# Patient Record
Sex: Male | Born: 1940 | Race: White | Hispanic: No | Marital: Married | Smoking: Former smoker
Health system: Southern US, Community
[De-identification: ages and names within clinical notes are randomized; demographics above are authoritative.]

## PROBLEM LIST (undated history)

## (undated) DIAGNOSIS — E119 Type 2 diabetes mellitus without complications: Secondary | ICD-10-CM

## (undated) DIAGNOSIS — I10 Essential (primary) hypertension: Secondary | ICD-10-CM

## (undated) DIAGNOSIS — F4310 Post-traumatic stress disorder, unspecified: Secondary | ICD-10-CM

## (undated) DIAGNOSIS — I2510 Atherosclerotic heart disease of native coronary artery without angina pectoris: Secondary | ICD-10-CM

## (undated) DIAGNOSIS — I6529 Occlusion and stenosis of unspecified carotid artery: Secondary | ICD-10-CM

## (undated) DIAGNOSIS — K219 Gastro-esophageal reflux disease without esophagitis: Secondary | ICD-10-CM

## (undated) DIAGNOSIS — G4730 Sleep apnea, unspecified: Secondary | ICD-10-CM

## (undated) DIAGNOSIS — IMO0001 Reserved for inherently not codable concepts without codable children: Secondary | ICD-10-CM

## (undated) DIAGNOSIS — E785 Hyperlipidemia, unspecified: Secondary | ICD-10-CM

## (undated) DIAGNOSIS — G2581 Restless legs syndrome: Secondary | ICD-10-CM

## (undated) DIAGNOSIS — M1990 Unspecified osteoarthritis, unspecified site: Secondary | ICD-10-CM

## (undated) DIAGNOSIS — Z860100 Personal history of colon polyps, unspecified: Secondary | ICD-10-CM

## (undated) DIAGNOSIS — K5730 Diverticulosis of large intestine without perforation or abscess without bleeding: Secondary | ICD-10-CM

## (undated) DIAGNOSIS — L719 Rosacea, unspecified: Secondary | ICD-10-CM

## (undated) HISTORY — DX: Occlusion and stenosis of unspecified carotid artery: I65.29

## (undated) HISTORY — DX: Diverticulosis of large intestine without perforation or abscess without bleeding: K57.30

## (undated) HISTORY — DX: Hyperlipidemia, unspecified: E78.5

## (undated) HISTORY — DX: Rosacea, unspecified: L71.9

## (undated) HISTORY — DX: Personal history of colon polyps, unspecified: Z86.0100

## (undated) HISTORY — DX: Sleep apnea, unspecified: G47.30

## (undated) HISTORY — DX: Reserved for inherently not codable concepts without codable children: IMO0001

## (undated) HISTORY — DX: Post-traumatic stress disorder, unspecified: F43.10

## (undated) HISTORY — DX: Gastro-esophageal reflux disease without esophagitis: K21.9

## (undated) HISTORY — DX: Personal history of colonic polyps: Z86.010

## (undated) HISTORY — DX: Type 2 diabetes mellitus without complications: E11.9

## (undated) HISTORY — DX: Unspecified osteoarthritis, unspecified site: M19.90

## (undated) HISTORY — DX: Atherosclerotic heart disease of native coronary artery without angina pectoris: I25.10

## (undated) HISTORY — DX: Essential (primary) hypertension: I10

## (undated) HISTORY — DX: Restless legs syndrome: G25.81

---

## 1979-07-24 HISTORY — PX: KNEE SURGERY: SHX244

## 2000-09-16 ENCOUNTER — Observation Stay (HOSPITAL_COMMUNITY): Admission: AD | Admit: 2000-09-16 | Discharge: 2000-09-18 | Payer: Self-pay | Admitting: Cardiology

## 2000-09-16 ENCOUNTER — Encounter: Payer: Self-pay | Admitting: Cardiology

## 2000-12-23 ENCOUNTER — Encounter: Payer: Self-pay | Admitting: Cardiology

## 2000-12-23 ENCOUNTER — Inpatient Hospital Stay (HOSPITAL_COMMUNITY): Admission: AD | Admit: 2000-12-23 | Discharge: 2000-12-25 | Payer: Self-pay | Admitting: Cardiology

## 2001-07-22 ENCOUNTER — Ambulatory Visit (HOSPITAL_COMMUNITY): Admission: RE | Admit: 2001-07-22 | Discharge: 2001-07-23 | Payer: Self-pay | Admitting: Cardiology

## 2003-05-07 ENCOUNTER — Encounter: Payer: Self-pay | Admitting: Gastroenterology

## 2003-10-19 ENCOUNTER — Encounter: Payer: Self-pay | Admitting: Internal Medicine

## 2004-10-03 ENCOUNTER — Ambulatory Visit: Payer: Self-pay | Admitting: Internal Medicine

## 2005-03-25 ENCOUNTER — Emergency Department (HOSPITAL_COMMUNITY): Admission: EM | Admit: 2005-03-25 | Discharge: 2005-03-25 | Payer: Self-pay | Admitting: Emergency Medicine

## 2005-03-30 ENCOUNTER — Ambulatory Visit: Payer: Self-pay | Admitting: Internal Medicine

## 2005-04-12 ENCOUNTER — Ambulatory Visit: Payer: Self-pay | Admitting: Cardiology

## 2005-04-19 ENCOUNTER — Ambulatory Visit: Payer: Self-pay

## 2005-05-03 ENCOUNTER — Ambulatory Visit: Payer: Self-pay | Admitting: Cardiology

## 2005-07-05 ENCOUNTER — Ambulatory Visit: Payer: Self-pay | Admitting: Cardiology

## 2005-08-02 ENCOUNTER — Ambulatory Visit: Payer: Self-pay | Admitting: Internal Medicine

## 2005-11-30 ENCOUNTER — Ambulatory Visit: Payer: Self-pay | Admitting: Internal Medicine

## 2005-12-19 ENCOUNTER — Ambulatory Visit: Payer: Self-pay | Admitting: Cardiovascular Disease

## 2005-12-19 ENCOUNTER — Ambulatory Visit (HOSPITAL_COMMUNITY): Admission: RE | Admit: 2005-12-19 | Discharge: 2005-12-19 | Payer: Self-pay | Admitting: Physician Assistant

## 2005-12-20 ENCOUNTER — Ambulatory Visit (HOSPITAL_COMMUNITY): Admission: RE | Admit: 2005-12-20 | Discharge: 2005-12-20 | Payer: Self-pay | Admitting: Cardiology

## 2005-12-20 ENCOUNTER — Ambulatory Visit: Payer: Self-pay | Admitting: Cardiology

## 2006-01-01 ENCOUNTER — Ambulatory Visit: Payer: Self-pay | Admitting: Cardiology

## 2006-01-04 ENCOUNTER — Ambulatory Visit: Payer: Self-pay

## 2006-06-26 ENCOUNTER — Ambulatory Visit: Payer: Self-pay | Admitting: Internal Medicine

## 2006-07-04 ENCOUNTER — Ambulatory Visit: Payer: Self-pay | Admitting: Cardiology

## 2006-07-26 ENCOUNTER — Ambulatory Visit: Payer: Self-pay

## 2006-08-08 ENCOUNTER — Ambulatory Visit: Payer: Self-pay | Admitting: Cardiology

## 2006-08-19 ENCOUNTER — Encounter: Payer: Self-pay | Admitting: Cardiology

## 2006-08-19 ENCOUNTER — Ambulatory Visit: Payer: Self-pay

## 2006-09-05 ENCOUNTER — Ambulatory Visit: Payer: Self-pay | Admitting: Cardiovascular Disease

## 2006-09-17 ENCOUNTER — Ambulatory Visit: Payer: Self-pay | Admitting: Cardiology

## 2006-09-25 ENCOUNTER — Ambulatory Visit: Payer: Self-pay | Admitting: Cardiovascular Disease

## 2006-12-24 DIAGNOSIS — K219 Gastro-esophageal reflux disease without esophagitis: Secondary | ICD-10-CM

## 2006-12-24 DIAGNOSIS — I10 Essential (primary) hypertension: Secondary | ICD-10-CM | POA: Insufficient documentation

## 2006-12-24 DIAGNOSIS — N400 Enlarged prostate without lower urinary tract symptoms: Secondary | ICD-10-CM

## 2006-12-24 DIAGNOSIS — K5730 Diverticulosis of large intestine without perforation or abscess without bleeding: Secondary | ICD-10-CM | POA: Insufficient documentation

## 2006-12-24 DIAGNOSIS — N529 Male erectile dysfunction, unspecified: Secondary | ICD-10-CM

## 2006-12-24 DIAGNOSIS — Z860100 Personal history of colon polyps, unspecified: Secondary | ICD-10-CM | POA: Insufficient documentation

## 2006-12-24 DIAGNOSIS — I2510 Atherosclerotic heart disease of native coronary artery without angina pectoris: Secondary | ICD-10-CM | POA: Insufficient documentation

## 2006-12-24 DIAGNOSIS — G2589 Other specified extrapyramidal and movement disorders: Secondary | ICD-10-CM | POA: Insufficient documentation

## 2006-12-24 DIAGNOSIS — M1990 Unspecified osteoarthritis, unspecified site: Secondary | ICD-10-CM | POA: Insufficient documentation

## 2006-12-24 DIAGNOSIS — E1149 Type 2 diabetes mellitus with other diabetic neurological complication: Secondary | ICD-10-CM

## 2006-12-24 DIAGNOSIS — L719 Rosacea, unspecified: Secondary | ICD-10-CM

## 2006-12-24 DIAGNOSIS — F4310 Post-traumatic stress disorder, unspecified: Secondary | ICD-10-CM

## 2006-12-24 DIAGNOSIS — G4730 Sleep apnea, unspecified: Secondary | ICD-10-CM | POA: Insufficient documentation

## 2006-12-24 DIAGNOSIS — E785 Hyperlipidemia, unspecified: Secondary | ICD-10-CM

## 2006-12-26 ENCOUNTER — Ambulatory Visit: Payer: Self-pay | Admitting: Internal Medicine

## 2006-12-26 DIAGNOSIS — D649 Anemia, unspecified: Secondary | ICD-10-CM | POA: Insufficient documentation

## 2006-12-27 LAB — CONVERTED CEMR LAB
Basophils Absolute: 0.1 10*3/uL (ref 0.0–0.1)
Eosinophils Absolute: 0.2 10*3/uL (ref 0.0–0.6)
Hgb A1c MFr Bld: 7.9 % — ABNORMAL HIGH (ref 4.6–6.0)
Lymphocytes Relative: 21 % (ref 12.0–46.0)
Monocytes Absolute: 0.8 10*3/uL — ABNORMAL HIGH (ref 0.2–0.7)
Neutro Abs: 5.5 10*3/uL (ref 1.4–7.7)
Neutrophils Relative %: 66.5 % (ref 43.0–77.0)
Platelets: 135 10*3/uL — ABNORMAL LOW (ref 150–400)
WBC: 8.3 10*3/uL (ref 4.5–10.5)

## 2007-01-14 ENCOUNTER — Ambulatory Visit: Payer: Self-pay

## 2007-01-27 ENCOUNTER — Encounter (INDEPENDENT_AMBULATORY_CARE_PROVIDER_SITE_OTHER): Payer: Self-pay | Admitting: *Deleted

## 2007-01-29 ENCOUNTER — Ambulatory Visit: Payer: Self-pay | Admitting: Cardiology

## 2007-02-05 ENCOUNTER — Ambulatory Visit: Payer: Self-pay | Admitting: Internal Medicine

## 2007-02-24 ENCOUNTER — Ambulatory Visit: Payer: Self-pay | Admitting: Family Medicine

## 2007-02-24 DIAGNOSIS — R609 Edema, unspecified: Secondary | ICD-10-CM | POA: Insufficient documentation

## 2007-02-25 LAB — CONVERTED CEMR LAB
ALT: 36 units/L (ref 0–53)
AST: 44 units/L — ABNORMAL HIGH (ref 0–37)
Alkaline Phosphatase: 55 units/L (ref 39–117)
Bilirubin, Direct: 0.1 mg/dL (ref 0.0–0.3)
CO2: 27 meq/L (ref 19–32)
Calcium: 9.1 mg/dL (ref 8.4–10.5)
Creatinine, Ser: 1.3 mg/dL (ref 0.4–1.5)
GFR calc Af Amer: 71 mL/min
GFR calc non Af Amer: 59 mL/min
Phosphorus: 3.2 mg/dL (ref 2.3–4.6)
Pro B Natriuretic peptide (BNP): 28 pg/mL (ref 0.0–100.0)
Sodium: 141 meq/L (ref 135–145)
Total Bilirubin: 0.7 mg/dL (ref 0.3–1.2)

## 2007-05-27 ENCOUNTER — Inpatient Hospital Stay (HOSPITAL_COMMUNITY): Admission: EM | Admit: 2007-05-27 | Discharge: 2007-06-02 | Payer: Self-pay | Admitting: Emergency Medicine

## 2007-05-27 ENCOUNTER — Encounter: Payer: Self-pay | Admitting: Internal Medicine

## 2007-05-27 ENCOUNTER — Ambulatory Visit: Payer: Self-pay | Admitting: Cardiovascular Disease

## 2007-06-09 ENCOUNTER — Ambulatory Visit: Payer: Self-pay | Admitting: Internal Medicine

## 2007-06-13 ENCOUNTER — Ambulatory Visit: Payer: Self-pay | Admitting: Gastroenterology

## 2007-06-16 ENCOUNTER — Ambulatory Visit: Payer: Self-pay | Admitting: Cardiology

## 2007-06-18 ENCOUNTER — Ambulatory Visit: Payer: Self-pay | Admitting: Gastroenterology

## 2007-06-18 DIAGNOSIS — K222 Esophageal obstruction: Secondary | ICD-10-CM

## 2007-06-25 ENCOUNTER — Ambulatory Visit: Payer: Self-pay | Admitting: Cardiology

## 2007-06-26 ENCOUNTER — Observation Stay (HOSPITAL_COMMUNITY): Admission: EM | Admit: 2007-06-26 | Discharge: 2007-06-28 | Payer: Self-pay | Admitting: Emergency Medicine

## 2007-06-26 ENCOUNTER — Ambulatory Visit: Payer: Self-pay | Admitting: Cardiology

## 2007-06-28 ENCOUNTER — Encounter: Payer: Self-pay | Admitting: Internal Medicine

## 2007-07-14 ENCOUNTER — Ambulatory Visit: Payer: Self-pay | Admitting: Cardiology

## 2007-07-14 ENCOUNTER — Encounter: Payer: Self-pay | Admitting: Internal Medicine

## 2007-07-14 LAB — CONVERTED CEMR LAB
Basophils Absolute: 0.1 10*3/uL (ref 0.0–0.1)
HCT: 32.7 % — ABNORMAL LOW (ref 39.0–52.0)
Lymphocytes Relative: 25.3 % (ref 12.0–46.0)
Monocytes Absolute: 0.7 10*3/uL (ref 0.2–0.7)
Neutro Abs: 4 10*3/uL (ref 1.4–7.7)
Platelets: 176 10*3/uL (ref 150–400)
RDW: 12.1 % (ref 11.5–14.6)

## 2007-07-15 IMAGING — CT CT ANGIO AOBIFEM WO/W CM
1 of 5 series · 13 of 46 positions shown, 18 images · IV contrast (omnipaque)
Comparison: none

CLINICAL DATA: Lower extremity claudication.  Cardiac stent.
 CT ANGIO AORTA AND BILATERAL FEMORAL RUNOFF  WITHOUT AND WITHOUT CONTRAST ? 09/17/06 AT 5888 HOURS:
TECHNIQUE: Precontrast images.  Postcontrast images reconstructed in multiplanar format.
 Contrast:  150 cc of Omnipaque 350.

[Series 2: runoff_w/o 3.0 b30f st · axial · 0.92mm/px · z∈[-1348,-88]mm · 13 of 582 slices shown, 18 images]
[im 39/582  soft-tissue]
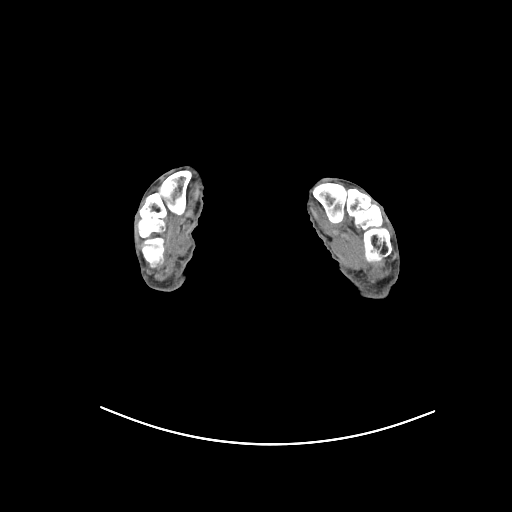
[im 39/582  bone]
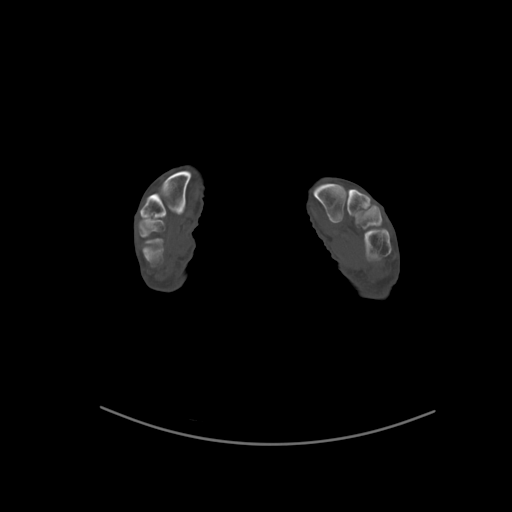
[im 78/582  soft-tissue]
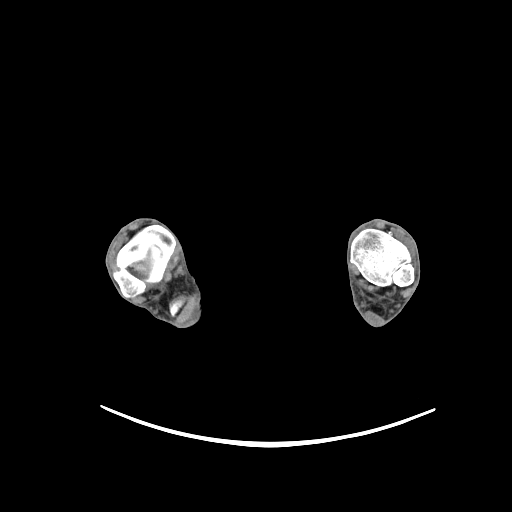
[im 117/582  soft-tissue]
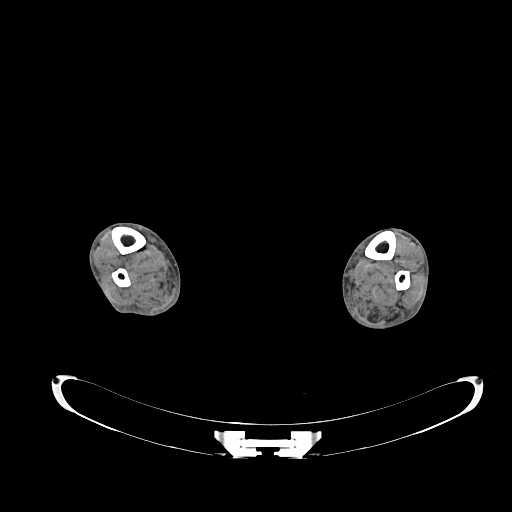
[im 194/582  soft-tissue]
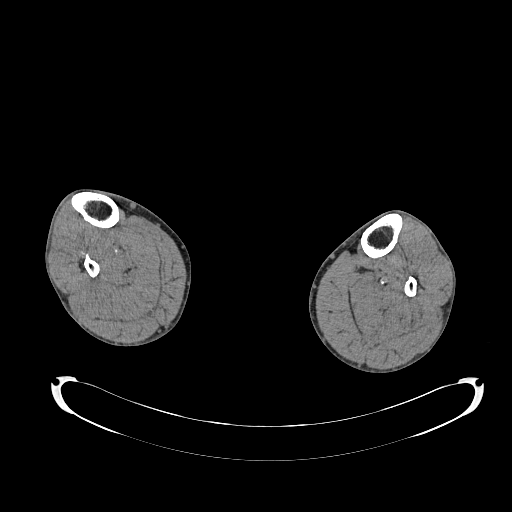
[im 233/582  soft-tissue]
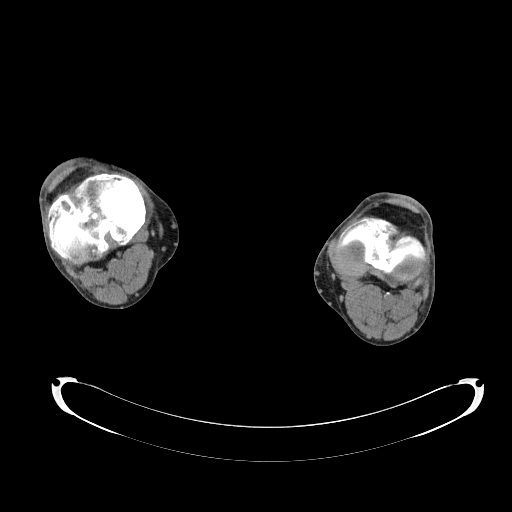
[im 272/582  soft-tissue]
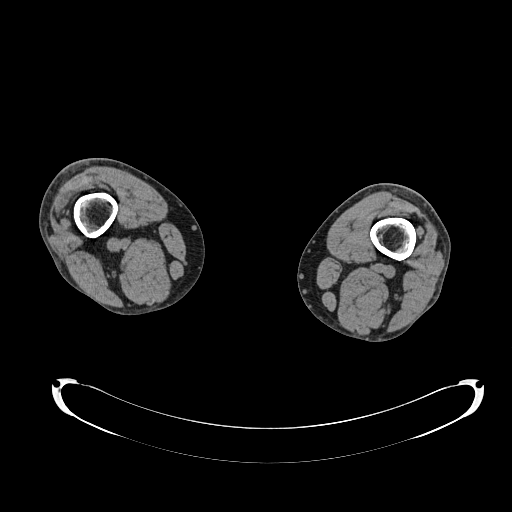
[im 310/582  soft-tissue]
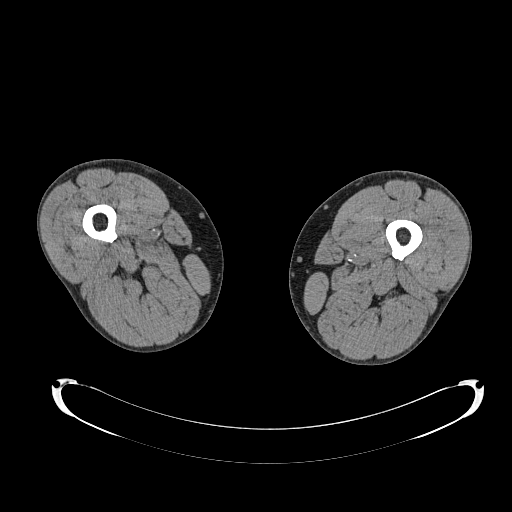
[im 349/582  soft-tissue]
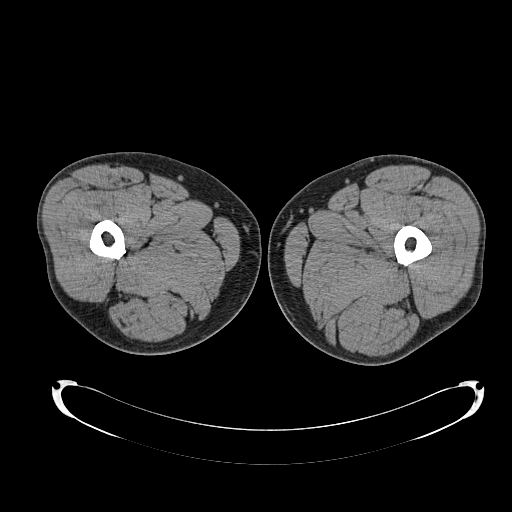
[im 388/582  soft-tissue]
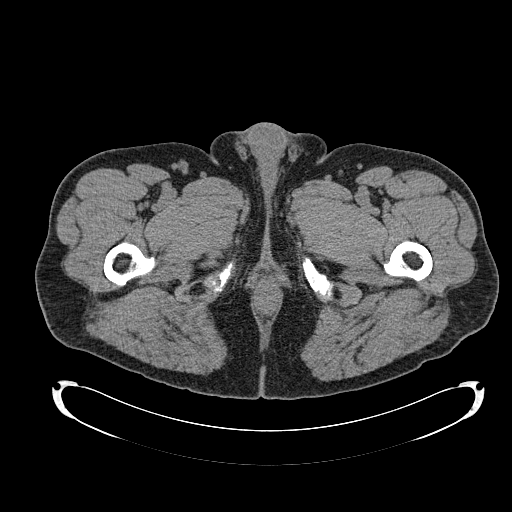
[im 388/582  bone]
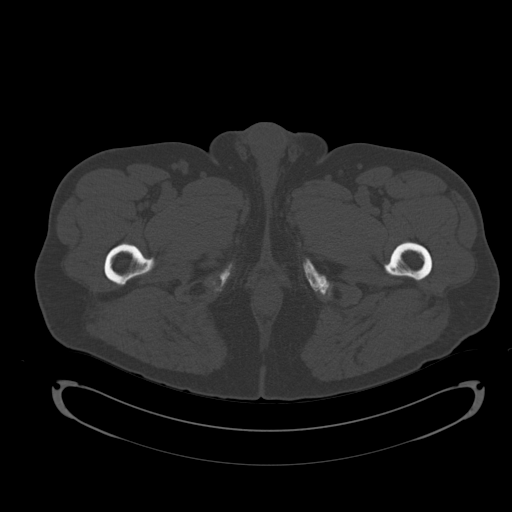
[im 427/582  lung]
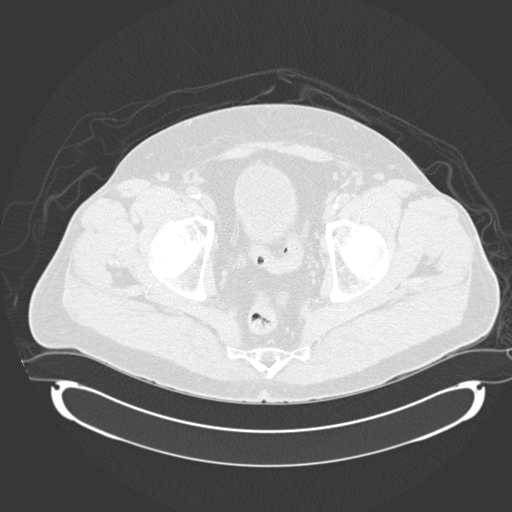
[im 465/582  soft-tissue]
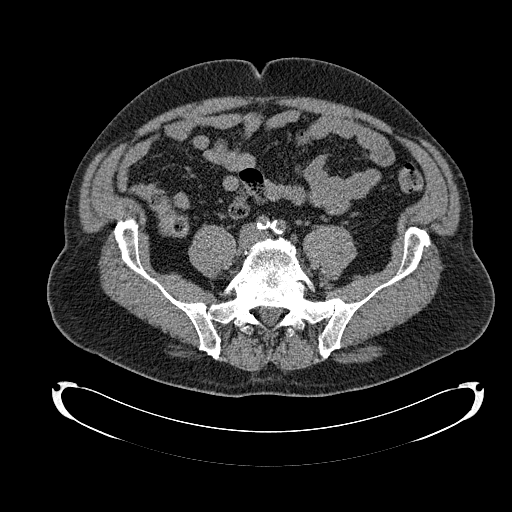
[im 465/582  lung]
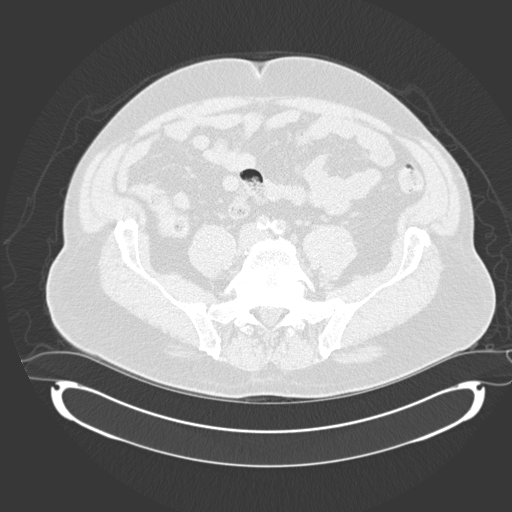
[im 504/582  soft-tissue]
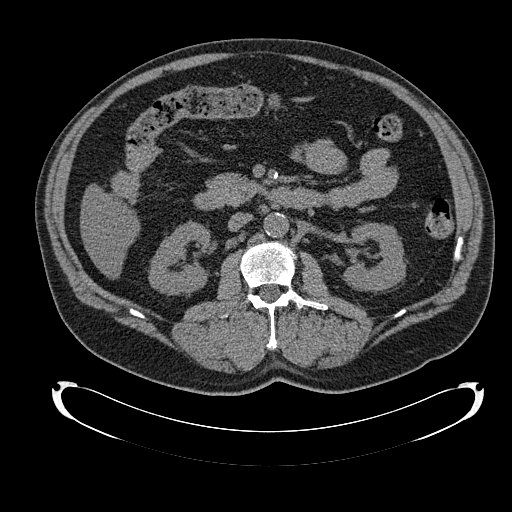
[im 504/582  lung]
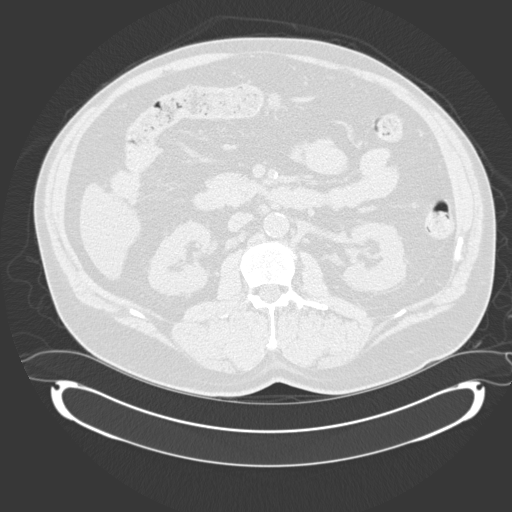
[im 543/582  soft-tissue]
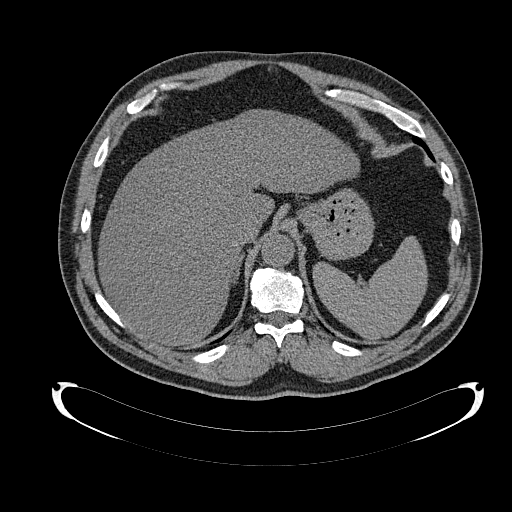
[im 543/582  lung]
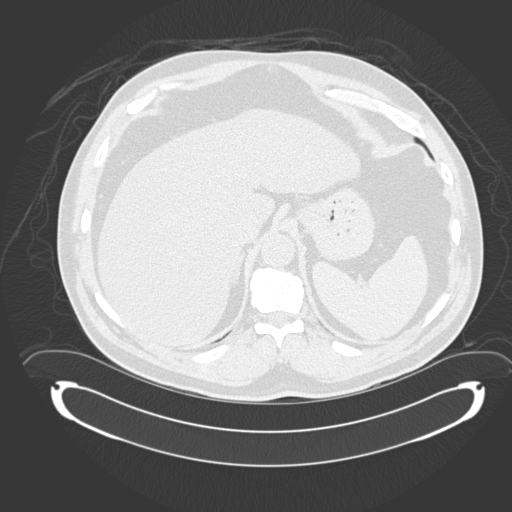

[13 of 46 positions shown; findings below may reference images not displayed]

FINDINGS: The aorta is nonaneurysmal and patent.  Single renal arteries are patent bilaterally.  There is marked narrowing at the origin of the celiac axis. The SMA is patent.  Branch vessels of the celiac are grossly patent.  The origin of the IMA is moderately diseased.  
 The right common iliac artery is patent.  The right external iliac artery is patent.  The right internal iliac artery is patent.  The left common iliac, external iliac, and internal iliac arteries are patent.
 Right lower extremity runoff demonstrates that the right common femoral and profunda femoral arteries are patent.  Mild diffuse atherosclerotic changes of the distal right superficial femoral artery are present without significant focal stenosis.  There is an approximately 40% stenosis of the right popliteal artery just below the knee joint. This is otherwise patent.  The right trifurcation vessels are patent to the ankle for three vessel runoff.  The left common femoral and profunda femoral arteries are patent.  Minimal atherosclerotic changes of the distal left superficial femoral artery are seen.  The left popliteal artery is patent. The left trifurcation vessels are patent to the left ankle for three vessel runoff on the left.  
 The liver, gallbladder, spleen, pancreas, adrenal glands, and kidneys are within normal limits.  There is no free fluid or abnormal adenopathy.  Degenerative changes are noted in the spine.  Diverticulosis of the sigmoid colon is present without diverticulitis.  L5 pars defects are noted with Grade I spondylolisthesis at L5-S1 noted.
IMPRESSION: 1.  No significant aortoiliac occlusive disease.
 2.  40% narrowing of the right popliteal artery, otherwise three vessel runoff on the right.
 3.  Three vessel runoff on the left with no significant arterial occlusive disease in the left lower extremity.
 4.  Grade I spondylolisthesis at L5-S1.

## 2007-08-01 ENCOUNTER — Ambulatory Visit: Payer: Self-pay | Admitting: Internal Medicine

## 2007-08-04 ENCOUNTER — Encounter (INDEPENDENT_AMBULATORY_CARE_PROVIDER_SITE_OTHER): Payer: Self-pay | Admitting: *Deleted

## 2007-08-04 ENCOUNTER — Encounter: Payer: Self-pay | Admitting: Internal Medicine

## 2007-08-04 LAB — CONVERTED CEMR LAB
ALT: 28 units/L (ref 0–53)
AST: 32 units/L (ref 0–37)
Albumin: 3.8 g/dL (ref 3.5–5.2)
Alkaline Phosphatase: 66 units/L (ref 39–117)
BUN: 25 mg/dL — ABNORMAL HIGH (ref 6–23)
Bilirubin, Direct: 0.1 mg/dL (ref 0.0–0.3)
Creatinine,U: 114.7 mg/dL
Eosinophils Absolute: 0.1 10*3/uL (ref 0.0–0.6)
GFR calc Af Amer: 77 mL/min
HDL: 34.4 mg/dL — ABNORMAL LOW (ref >39.0)
MCHC: 35.2 g/dL (ref 30.0–36.0)
Microalb, Ur: 1.3 mg/dL (ref 0.0–1.9)
Monocytes Absolute: 0.6 10*3/uL (ref 0.2–0.7)
Neutro Abs: 3.4 10*3/uL (ref 1.4–7.7)
Neutrophils Relative %: 62.7 % (ref 43.0–77.0)
Platelets: 168 10*3/uL (ref 150–400)
Potassium: 5.3 meq/L — ABNORMAL HIGH (ref 3.5–5.1)
RBC: 3.64 M/uL — ABNORMAL LOW (ref 4.22–5.81)
Sodium: 131 meq/L — ABNORMAL LOW (ref 135–145)
Total Bilirubin: 0.8 mg/dL (ref 0.3–1.2)
Total CHOL/HDL Ratio: 5.2
Total Protein: 6.6 g/dL (ref 6.0–8.3)
Triglycerides: 320 mg/dL (ref 0–149)

## 2007-08-06 ENCOUNTER — Ambulatory Visit: Payer: Self-pay | Admitting: Internal Medicine

## 2007-08-06 ENCOUNTER — Ambulatory Visit: Payer: Self-pay | Admitting: Cardiology

## 2007-08-06 LAB — CONVERTED CEMR LAB
OCCULT 1: NEGATIVE
OCCULT 2: NEGATIVE
OCCULT 3: NEGATIVE

## 2007-08-07 ENCOUNTER — Encounter (INDEPENDENT_AMBULATORY_CARE_PROVIDER_SITE_OTHER): Payer: Self-pay | Admitting: *Deleted

## 2007-08-07 LAB — CONVERTED CEMR LAB
Fecal Occult Blood: NEGATIVE
OCCULT 1: NEGATIVE
OCCULT 2: NEGATIVE
OCCULT 3: NEGATIVE
OCCULT 4: NEGATIVE
OCCULT 5: NEGATIVE

## 2007-08-15 ENCOUNTER — Ambulatory Visit: Payer: Self-pay | Admitting: Internal Medicine

## 2007-08-15 DIAGNOSIS — M19019 Primary osteoarthritis, unspecified shoulder: Secondary | ICD-10-CM | POA: Insufficient documentation

## 2007-08-16 DIAGNOSIS — K648 Other hemorrhoids: Secondary | ICD-10-CM | POA: Insufficient documentation

## 2007-08-16 DIAGNOSIS — N189 Chronic kidney disease, unspecified: Secondary | ICD-10-CM | POA: Insufficient documentation

## 2007-08-16 DIAGNOSIS — K2980 Duodenitis without bleeding: Secondary | ICD-10-CM | POA: Insufficient documentation

## 2007-08-22 ENCOUNTER — Encounter: Payer: Self-pay | Admitting: Internal Medicine

## 2007-08-28 ENCOUNTER — Ambulatory Visit: Payer: Self-pay | Admitting: Cardiology

## 2007-09-03 ENCOUNTER — Telehealth: Payer: Self-pay | Admitting: Internal Medicine

## 2007-09-04 ENCOUNTER — Ambulatory Visit: Payer: Self-pay | Admitting: Internal Medicine

## 2007-09-04 DIAGNOSIS — E1149 Type 2 diabetes mellitus with other diabetic neurological complication: Secondary | ICD-10-CM

## 2007-09-05 ENCOUNTER — Ambulatory Visit: Payer: Self-pay | Admitting: Internal Medicine

## 2007-09-08 ENCOUNTER — Encounter: Payer: Self-pay | Admitting: Internal Medicine

## 2007-09-11 ENCOUNTER — Ambulatory Visit: Payer: Self-pay | Admitting: Internal Medicine

## 2007-09-15 ENCOUNTER — Telehealth (INDEPENDENT_AMBULATORY_CARE_PROVIDER_SITE_OTHER): Payer: Self-pay | Admitting: *Deleted

## 2007-09-17 ENCOUNTER — Ambulatory Visit: Payer: Self-pay | Admitting: Cardiology

## 2007-09-17 ENCOUNTER — Telehealth (INDEPENDENT_AMBULATORY_CARE_PROVIDER_SITE_OTHER): Payer: Self-pay | Admitting: *Deleted

## 2007-10-09 ENCOUNTER — Ambulatory Visit: Payer: Self-pay | Admitting: Internal Medicine

## 2007-11-05 ENCOUNTER — Encounter: Payer: Self-pay | Admitting: Internal Medicine

## 2007-11-14 ENCOUNTER — Encounter: Payer: Self-pay | Admitting: Internal Medicine

## 2007-11-20 ENCOUNTER — Encounter: Payer: Self-pay | Admitting: Internal Medicine

## 2007-12-05 ENCOUNTER — Telehealth: Payer: Self-pay | Admitting: Internal Medicine

## 2007-12-18 ENCOUNTER — Ambulatory Visit: Payer: Self-pay | Admitting: Internal Medicine

## 2007-12-19 LAB — CONVERTED CEMR LAB
ALT: 29 units/L (ref 0–53)
AST: 33 units/L (ref 0–37)
Albumin: 3.8 g/dL (ref 3.5–5.2)
Alkaline Phosphatase: 50 units/L (ref 39–117)
BUN: 33 mg/dL — ABNORMAL HIGH (ref 6–23)
Basophils Absolute: 0 10*3/uL (ref 0.0–0.1)
Bilirubin, Direct: 0.1 mg/dL (ref 0.0–0.3)
Calcium: 9 mg/dL (ref 8.4–10.5)
Creatinine, Ser: 2.2 mg/dL — ABNORMAL HIGH (ref 0.4–1.5)
Eosinophils Absolute: 0.1 10*3/uL (ref 0.0–0.7)
GFR calc Af Amer: 38 mL/min
GFR calc non Af Amer: 32 mL/min
MCV: 93 fL (ref 78.0–100.0)
Monocytes Relative: 13.6 % — ABNORMAL HIGH (ref 3.0–12.0)
Neutrophils Relative %: 49.9 % (ref 43.0–77.0)
Phosphorus: 4.1 mg/dL (ref 2.3–4.6)
Sodium: 137 meq/L (ref 135–145)
Total Bilirubin: 0.7 mg/dL (ref 0.3–1.2)
Total Protein: 6.2 g/dL (ref 6.0–8.3)
WBC: 4.5 10*3/uL (ref 4.5–10.5)

## 2008-01-02 ENCOUNTER — Ambulatory Visit: Payer: Self-pay | Admitting: Internal Medicine

## 2008-01-05 LAB — CONVERTED CEMR LAB
BUN: 23 mg/dL (ref 6–23)
Calcium: 8.9 mg/dL (ref 8.4–10.5)
GFR calc non Af Amer: 58 mL/min
Potassium: 4.9 meq/L (ref 3.5–5.1)

## 2008-01-20 ENCOUNTER — Ambulatory Visit: Payer: Self-pay

## 2008-01-20 ENCOUNTER — Ambulatory Visit: Payer: Self-pay | Admitting: Cardiology

## 2008-01-30 ENCOUNTER — Ambulatory Visit: Payer: Self-pay | Admitting: Cardiology

## 2008-04-10 ENCOUNTER — Ambulatory Visit: Payer: Self-pay | Admitting: Cardiovascular Disease

## 2008-04-11 ENCOUNTER — Encounter: Payer: Self-pay | Admitting: Cardiology

## 2008-04-11 ENCOUNTER — Inpatient Hospital Stay (HOSPITAL_COMMUNITY): Admission: EM | Admit: 2008-04-11 | Discharge: 2008-04-13 | Payer: Self-pay | Admitting: Emergency Medicine

## 2008-04-21 ENCOUNTER — Inpatient Hospital Stay (HOSPITAL_COMMUNITY): Admission: EM | Admit: 2008-04-21 | Discharge: 2008-04-24 | Payer: Self-pay | Admitting: Emergency Medicine

## 2008-04-21 ENCOUNTER — Ambulatory Visit: Payer: Self-pay | Admitting: Cardiology

## 2008-04-27 ENCOUNTER — Ambulatory Visit: Payer: Self-pay | Admitting: Cardiology

## 2008-05-06 ENCOUNTER — Encounter: Payer: Self-pay | Admitting: Internal Medicine

## 2008-05-18 ENCOUNTER — Ambulatory Visit: Payer: Self-pay | Admitting: Cardiology

## 2008-06-25 ENCOUNTER — Ambulatory Visit: Payer: Self-pay | Admitting: Cardiology

## 2008-10-18 ENCOUNTER — Encounter: Payer: Self-pay | Admitting: Internal Medicine

## 2008-11-16 ENCOUNTER — Ambulatory Visit: Payer: Self-pay | Admitting: Cardiology

## 2008-11-16 ENCOUNTER — Encounter: Payer: Self-pay | Admitting: Internal Medicine

## 2008-12-13 ENCOUNTER — Ambulatory Visit: Payer: Self-pay | Admitting: Internal Medicine

## 2008-12-13 DIAGNOSIS — S40269A Insect bite (nonvenomous) of unspecified shoulder, initial encounter: Secondary | ICD-10-CM | POA: Insufficient documentation

## 2008-12-13 DIAGNOSIS — W57XXXA Bitten or stung by nonvenomous insect and other nonvenomous arthropods, initial encounter: Secondary | ICD-10-CM

## 2008-12-30 ENCOUNTER — Ambulatory Visit: Payer: Self-pay

## 2008-12-30 ENCOUNTER — Encounter: Payer: Self-pay | Admitting: Cardiology

## 2008-12-30 DIAGNOSIS — I6529 Occlusion and stenosis of unspecified carotid artery: Secondary | ICD-10-CM

## 2008-12-30 DIAGNOSIS — E669 Obesity, unspecified: Secondary | ICD-10-CM

## 2009-01-05 ENCOUNTER — Encounter: Payer: Self-pay | Admitting: Cardiology

## 2009-01-19 ENCOUNTER — Ambulatory Visit: Payer: Self-pay | Admitting: Cardiology

## 2009-01-19 ENCOUNTER — Ambulatory Visit: Payer: Self-pay

## 2009-01-19 DIAGNOSIS — I2510 Atherosclerotic heart disease of native coronary artery without angina pectoris: Secondary | ICD-10-CM

## 2009-01-26 ENCOUNTER — Telehealth: Payer: Self-pay | Admitting: Cardiology

## 2009-04-11 ENCOUNTER — Encounter (INDEPENDENT_AMBULATORY_CARE_PROVIDER_SITE_OTHER): Payer: Self-pay | Admitting: *Deleted

## 2009-05-19 ENCOUNTER — Encounter: Payer: Self-pay | Admitting: Cardiology

## 2009-05-19 ENCOUNTER — Encounter: Payer: Self-pay | Admitting: Internal Medicine

## 2009-05-19 ENCOUNTER — Ambulatory Visit: Payer: Self-pay | Admitting: Cardiology

## 2009-06-24 ENCOUNTER — Encounter (INDEPENDENT_AMBULATORY_CARE_PROVIDER_SITE_OTHER): Payer: Self-pay | Admitting: *Deleted

## 2009-06-27 ENCOUNTER — Encounter: Payer: Self-pay | Admitting: Cardiology

## 2009-06-28 ENCOUNTER — Ambulatory Visit: Payer: Self-pay | Admitting: Cardiology

## 2009-06-28 DIAGNOSIS — F17200 Nicotine dependence, unspecified, uncomplicated: Secondary | ICD-10-CM

## 2009-08-09 ENCOUNTER — Encounter: Payer: Self-pay | Admitting: Cardiology

## 2009-08-09 ENCOUNTER — Ambulatory Visit: Payer: Self-pay | Admitting: Cardiology

## 2009-10-18 ENCOUNTER — Encounter (INDEPENDENT_AMBULATORY_CARE_PROVIDER_SITE_OTHER): Payer: Self-pay | Admitting: *Deleted

## 2009-11-21 LAB — CONVERTED CEMR LAB: Hgb A1c MFr Bld: 8.1 %

## 2009-12-26 ENCOUNTER — Ambulatory Visit: Payer: Self-pay | Admitting: Internal Medicine

## 2009-12-26 DIAGNOSIS — R350 Frequency of micturition: Secondary | ICD-10-CM

## 2009-12-26 LAB — CONVERTED CEMR LAB
Bilirubin Urine: NEGATIVE
Glucose, Urine, Semiquant: >=1000
Protein, U semiquant: NEGATIVE
Urobilinogen, UA: 0.2

## 2010-01-02 ENCOUNTER — Ambulatory Visit: Payer: Self-pay | Admitting: Cardiology

## 2010-01-02 DIAGNOSIS — I359 Nonrheumatic aortic valve disorder, unspecified: Secondary | ICD-10-CM | POA: Insufficient documentation

## 2010-01-25 ENCOUNTER — Encounter: Payer: Self-pay | Admitting: Cardiology

## 2010-01-26 ENCOUNTER — Ambulatory Visit: Payer: Self-pay

## 2010-01-26 ENCOUNTER — Ambulatory Visit: Payer: Self-pay | Admitting: Cardiology

## 2010-01-26 ENCOUNTER — Encounter: Payer: Self-pay | Admitting: Cardiology

## 2010-01-26 ENCOUNTER — Encounter: Payer: Self-pay | Admitting: Cardiovascular Disease

## 2010-01-27 LAB — CONVERTED CEMR LAB
AST: 23 units/L (ref 0–37)
HDL: 29 mg/dL — ABNORMAL LOW (ref >39)
Total Bilirubin: 0.4 mg/dL (ref 0.3–1.2)
Total CHOL/HDL Ratio: 6.3
Total Protein: 6.3 g/dL (ref 6.0–8.3)

## 2010-02-03 ENCOUNTER — Telehealth (INDEPENDENT_AMBULATORY_CARE_PROVIDER_SITE_OTHER): Payer: Self-pay | Admitting: *Deleted

## 2010-03-24 ENCOUNTER — Encounter: Payer: Self-pay | Admitting: Internal Medicine

## 2010-03-24 ENCOUNTER — Ambulatory Visit: Payer: Self-pay | Admitting: Cardiology

## 2010-03-30 LAB — CONVERTED CEMR LAB
Total CHOL/HDL Ratio: 3.4
VLDL: 27 mg/dL (ref 0–40)

## 2010-06-20 ENCOUNTER — Encounter: Payer: Self-pay | Admitting: Cardiology

## 2010-06-22 ENCOUNTER — Ambulatory Visit: Payer: Self-pay | Admitting: Cardiology

## 2010-08-20 LAB — CONVERTED CEMR LAB
Albumin: 3.4 g/dL — ABNORMAL LOW (ref 3.5–5.2)
Bilirubin, Direct: 0.1 mg/dL (ref 0.0–0.3)
CO2: 27 meq/L (ref 19–32)
Chloride: 103 meq/L (ref 96–112)
Cholesterol: 118 mg/dL (ref 0–200)
Direct LDL: 49.1 mg/dL
Glucose, Bld: 84 mg/dL (ref 70–99)
HDL: 31.3 mg/dL — ABNORMAL LOW (ref >39.00)
Total Bilirubin: 0.9 mg/dL (ref 0.3–1.2)
Total CHOL/HDL Ratio: 4
Total Protein: 5.9 g/dL — ABNORMAL LOW (ref 6.0–8.3)
Triglycerides: 209 mg/dL — ABNORMAL HIGH (ref 0.0–149.0)

## 2010-08-24 NOTE — Assessment & Plan Note (Signed)
Summary: CHECK BS AFTER START INSULIN.   Vital Signs:  Patient Profile:   70 Years Old Male Weight:      247.25 pounds Temp:     96.7 degrees F oral Pulse rate:   72 / minute BP sitting:   106 / 50  (left arm) Cuff size:   large  Vitals Entered By: Wandra Mannan (September 11, 2007 8:44 AM)                 Chief Complaint:  check blood sugar after starting insulin.  History of Present Illness: No technical problems with lantus solostar Did meet with the diabetic counsellor briefly and will be going back Sugar this AM down to 123!!! this is the best it has been in a long time Generally had been over 200 regularly  Postprandial has been 168-266 in last few days No hypoglycemic spells--but did get a little dizzy after exercise yest. did discuss need to eat something before exercising when his sugar control is better  Really happy with the response in left shoulder to cortisone shot He would like to proceed with shot in right shoulder today    Current Allergies (reviewed today): * PLAVIX  Past Medical History:    Reviewed history from 09/04/2007 and no changes required:       Colonic polyps, hx of       Coronary artery disease---------Dr Hochrein       Diverticulosis, colon       GERD       Hyperlipidemia       Hypertension       Osteoarthritis--hands/knees       Benign prostatic hypertrophy       Rosacea       Restless legs syndrome       E.D.       Sleep apnea       PTSD       NIDDM              [facility]       Dr Lollie Marrow  [phone]         Past Surgical History:    Reviewed history from 08/01/2007 and no changes required:       1981  R knee surgery       2/02 Angioplasty ([facility])       1/03  CP--cath with stable CAD       5/04  Cardiolite--non-reversible defects  EF-61%       1/08  Adenosine myoview negative  EF-54%       2/08  CT angio negative for sig PVD       12/08  PCI/bare metal stent distal RCA   Social History:    Reviewed  history from 12/24/2006 and no changes required:       Retired--local truck driver       Married--2 sons       Former Smoker       Alcohol use-yes--occ beer    Review of Systems  The patient denies chest pain, syncope, and dyspnea on exhertion.         Has been able to exercise again with sugars down   Physical Exam  General:     alert and normal appearance.   Psych:     normally interactive, good eye contact, not anxious appearing, and not depressed appearing.      Impression & Recommendations:  Problem # 1:  DIABETES MELLITUS, TYPE II, UNCONTROLLED, W/NEUROLO COMPS (ICD-250.62) Assessment:  Improved markedly better now will [facility] with [facility] for approp counselling May need to be slow about increasing lantus in next week due to effects from cortisone shot keep April follow up  His updated medication list for this problem includes:    Lisinopril 40 Mg Tabs (Lisinopril) .Marland Kitchen... 1 daily    Glipizide 5 Mg Tabs (Glipizide) .Marland Kitchen... 2 two times a day    Metformin Hcl 1000 Mg Tabs (Metformin hcl) .Marland Kitchen... Take 1 tablet by mouth two times a day    Adult Aspirin Low Strength 81 Mg Tbdp (Aspirin) .Marland Kitchen... Take 1 tablet by mouth once a day    Lantus Solostar 100 Unit/ml Soln (Insulin glargine) .Marland Kitchen... 25 units daily   Problem # 2:  PRIMARY LOCALIZED OSTEOARTHROSIS SHOULDER REGION (ICD-715.11) Assessment: Comment Only PROCEDURE sterile prep posterior approach to right shoulder 2cc 2% lidocaine 40mg  kenalog, 9cc 2% lidocaine into joint  tolerated well  His updated medication list for this problem includes:    Acetaminophen 325 Mg Tabs (Acetaminophen) .Marland KitchenMarland KitchenMarland KitchenMarland Kitchen 3 every 6 hours as needed    Adult Aspirin Low Strength 81 Mg Tbdp (Aspirin) .Marland Kitchen... Take 1 tablet by mouth once a day  Orders: Joint Aspirate / Injection, Large (20610) Kenalog 10 mg inj (J3301)   Complete Medication List: 1)  Lisinopril 40 Mg Tabs (Lisinopril) .Marland Kitchen.. 1 daily 2)  Nortriptyline Hcl 25 Mg Caps  (Nortriptyline hcl) .... 2 daily 3)  Niaspan 1000 Mg Tbcr (Niacin (antihyperlipidemic)) .Marland Kitchen.. 1 daily 4)  Citalopram Hydrobromide 40 Mg Tabs (Citalopram hydrobromide) .Marland Kitchen.. 1& 1/2 daily 5)  Metoprolol Tartrate 50 Mg Tabs (Metoprolol tartrate) .Marland Kitchen.. 1 twice a day 6)  Simvastatin 40 Mg Tabs (Simvastatin) .... 1/2 daily 7)  Omeprazole 20 Mg Cpdr (Omeprazole) .... Take 1 tablet by mouth two times a day 8)  Hydrochlorothiazide 25 Mg Tabs (Hydrochlorothiazide) .... Take 1 tablet by mouth two times a day 9)  Acetaminophen 325 Mg Tabs (Acetaminophen) .... 3 every 6 hours as needed 10)  Clonazepam Odt 0.5 Mg Tbdp (Clonazepam) .... 2 hs 11)  Norvasc 10 Mg Tabs (Amlodipine besylate) .... 1/2 bid 12)  Glipizide 5 Mg Tabs (Glipizide) .... 2 two times a day 13)  Imdur 60 Mg Tb24 (Isosorbide mononitrate) .... Take 1 tablet by mouth once a day 14)  Clonidine Hcl 0.1 Mg Tabs (Clonidine hcl) .... Take 1 tablet by mouth two times a day 15)  Ticlopidine Hcl 250 Mg Tabs (Ticlopidine hcl) .... Take 1 tablet by mouth two times a day 16)  Tracer  17)  Metformin Hcl 1000 Mg Tabs (Metformin hcl) .... Take 1 tablet by mouth two times a day 18)  Adult Aspirin Low Strength 81 Mg Tbdp (Aspirin) .... Take 1 tablet by mouth once a day 19)  Lantus Solostar 100 Unit/ml Soln (Insulin glargine) .... 25 units daily   Patient Instructions: 1)  Keep April appointment    ] Current Allergies (reviewed today): * PLAVIX Current Medications (including changes made in today's visit):  LISINOPRIL 40 MG TABS (LISINOPRIL) 1 daily NORTRIPTYLINE HCL 25 MG  CAPS (NORTRIPTYLINE HCL) 2 daily NIASPAN 1000 MG TBCR (NIACIN (ANTIHYPERLIPIDEMIC)) 1 daily CITALOPRAM HYDROBROMIDE 40 MG TABS (CITALOPRAM HYDROBROMIDE) 1& 1/2 daily METOPROLOL TARTRATE 50 MG TABS (METOPROLOL TARTRATE) 1 twice a day SIMVASTATIN 40 MG TABS (SIMVASTATIN) 1/2 daily OMEPRAZOLE 20 MG CPDR (OMEPRAZOLE) Take 1 tablet by mouth two times a day HYDROCHLOROTHIAZIDE 25 MG  TABS (HYDROCHLOROTHIAZIDE) Take 1 tablet by mouth two times a day ACETAMINOPHEN 325 MG TABS (ACETAMINOPHEN) 3 every  6 hours as needed CLONAZEPAM ODT 0.5 MG  TBDP (CLONAZEPAM) 2 hs NORVASC 10 MG  TABS (AMLODIPINE BESYLATE) 1/2 bid GLIPIZIDE 5 MG  TABS (GLIPIZIDE) 2 two times a day IMDUR 60 MG  TB24 (ISOSORBIDE MONONITRATE) Take 1 tablet by mouth once a day CLONIDINE HCL 0.1 MG  TABS (CLONIDINE HCL) Take 1 tablet by mouth two times a day TICLOPIDINE HCL 250 MG  TABS (TICLOPIDINE HCL) Take 1 tablet by mouth two times a day * TRACER  METFORMIN HCL 1000 MG  TABS (METFORMIN HCL) Take 1 tablet by mouth two times a day ADULT ASPIRIN LOW STRENGTH 81 MG  TBDP (ASPIRIN) Take 1 tablet by mouth once a day LANTUS SOLOSTAR 100 UNIT/ML  SOLN (INSULIN GLARGINE) 25 units daily

## 2010-08-24 NOTE — Assessment & Plan Note (Signed)
Summary: 414.01 401.1  6 month   Visit Type:  Follow-up Primary Provider:  Cindee Salt MD  CC:  Coronary artery disease.  History of Present Illness: The patient presents for followup of the above. Since I last saw him he's had no new cardiovascular complaints. He was having chest discomfort 6 months ago but this seems to have improved. Currently he denies any new chest pressure, neck or arm discomfort. He is not having any new palpitations, presyncope or syncope. He is not describing any PND or orthopnea. He tries to do some walking for exercise. Continues to chew tobacco. He has had problems with frequent urination and is currently being treated for a possible urinary infection. He tries to watch his diet but his weight is up.  Current Medications (verified): 1)  Nortriptyline Hcl 25 Mg  Caps (Nortriptyline Hcl) .... 2 Daily 2)  Niaspan 1000 Mg Tbcr (Niacin (Antihyperlipidemic)) .Marland Kitchen.. 1 Daily 3)  Citalopram Hydrobromide 40 Mg Tabs (Citalopram Hydrobromide) .Marland Kitchen.. 1 Once Daily 4)  Metoprolol Tartrate 50 Mg Tabs (Metoprolol Tartrate) .... 3 Tabs Twice A Day 5)  Simvastatin 40 Mg Tabs (Simvastatin) .... 1/2 Daily 6)  Omeprazole 20 Mg Cpdr (Omeprazole) .... Take 1 Tablet By Mouth Two Times A Day 7)  Acetaminophen 325 Mg Tabs (Acetaminophen) .... 3 Every 6 Hours As Needed 8)  Clonazepam Odt 0.5 Mg  Tbdp (Clonazepam) .... Take 2 By Mouth At Bedtime 9)  Norvasc 10 Mg  Tabs (Amlodipine Besylate) .... Take 1/2 By Mouth Two Times A Day 10)  Isosorbide Dinitrate 20 Mg Tabs (Isosorbide Dinitrate) .... 2 By Mouth Three Times A Day 11)  Clonidine Hcl 0.1 Mg Tabs (Clonidine Hcl) .... 3 By Mouth Three Times A Day 12)  Lantus Solostar 100 Unit/ml  Soln (Insulin Glargine) .... Uad 13)  Lisinopril-Hydrochlorothiazide 20-12.5 Mg  Tabs (Lisinopril-Hydrochlorothiazide) .... Two Times A Day Tab 14)  Centrum Silver   Tabs (Multiple Vitamins-Minerals) .... Take 1 Tablet By Mouth Once A Day 15)  Prazosin Hcl 5  Mg Caps (Prazosin Hcl) .... Take One Tablet By Mouth Once Daily. 16)  Novolin R 100 Unit/ml Soln (Insulin Regular Human) .... Uad 17)  Pramipexole Dihydrochloride 0.125 Mg Tabs (Pramipexole Dihydrochloride) .... Take One Tablet By Mouth Once Daily. 18)  Aspirin Ec 325 Mg Tbec (Aspirin) .... Take One Tablet By Mouth Daily 19)  Tetracycline Hcl 250 Mg Caps (Tetracycline Hcl) .Marland Kitchen.. 1 By Mouth Q3-4 Days 20)  Precision Xtra Test Strips .... Tests Two Times A Day 21)  Ciprofloxacin Hcl 250 Mg Tabs (Ciprofloxacin Hcl) .Marland Kitchen.. 1 Tab By Mouth Two Times A Day For Prostate Infection  Allergies (verified): 1)  * Plavix  Past History:  Past Medical History: Colonic polyps, hx of Coronary artery disease (left main was normal, the LAD had a 50-60% stenosis in the mid vessel   and 60-70%                    stenosis in the distal vessel.  The first diagonal had an ostial 95% stenosis as it emerged from                            previous stented area.  The circumflex at 40% stenosis in the mid circumflex.  The right coronary                     artery had PDA with 90% stenosis, which has progressed  slightly from previous.)   Diverticulosis, colon GERD Hyperlipidemia Hypertension Osteoarthritis--hands/knees Benign prostatic hypertrophy Rosacea Restless legs syndrome E.D. Sleep apnea PTSD NIDDM Carotid artery stenosis   CON[facility]ULTANT[facility] Dr Lollie Marrow  [phone]  Review of Systems       As stated in the HPI and negative for all other systems.   Vital Signs:  Patient profile:   70 year old male Height:      72 inches Weight:      262 pounds BMI:     35.66 Pulse rate:   59 / minute Resp:     18 per minute BP sitting:   120 / 60  (right arm)  Vitals Entered By: Marrion Coy, CNA (January 02, 2010 11:20 AM)  Physical Exam  General:  Well developed, well nourished, in no acute distress. Head:  normocephalic and atraumatic Mouth:  Poor dentition  . Oral mucosa normal. Neck:  Neck supple, no  JVD. No masses, thyromegaly or abnormal cervical nodes. Chest Wall:  no deformities or breast masses noted Lungs:  Clear bilaterally to auscultation and percussion. Abdomen:  Bowel sounds positive; abdomen soft and non-tender without masses, organomegaly, or hernias noted. No hepatosplenomegaly, obese Msk:  Back normal, normal gait. Muscle strength and tone normal. Extremities:  Mbilateral lower extremity edema Neurologic:  Alert and oriented x 3. Skin:  Intact without lesions or rashes. Cervical Nodes:  no significant adenopathy Inguinal Nodes:  no significant adenopathy Psych:  Normal affect.   Detailed Cardiovascular Exam  Neck    Carotids: Carotids full and equal bilaterally, bilateral bruits left greater than right     Neck Veins: Normal, no JVD.    Heart    Inspection: no deformities or lifts noted.      Palpation: normal PMI with no thrills palpable.      Auscultation: S1 and S2 within normal limits, no S3, no S4, no clicks, no rubs, no murmurs.  Vascular    Abdominal Aorta: no palpable masses, pulsations, or audible bruits.      Femoral Pulses: normal femoral pulses bilaterally.      Pedal Pulses: normal pedal pulses bilaterally.      Radial Pulses: normal radial pulses bilaterally.      Peripheral Circulation: no clubbing, cyanosis   EKG  Procedure date:  01/02/2010  Findings:      Sinus rhythm, rate 59, axis within normal limits, intervals within normal limits, no acute ST-T wave changes.  Impression & Recommendations:  Problem # 1:  AORTIC VALVE DISORDERS (ICD-424.1) The patient has some mild aortic stenosis as well as septal hypertrophy. I will repeat an echocardiogram to evaluate this as the murmur is slightly louder. Orders: Echocardiogram (Echo)  Problem # 2:  TOBACCO USER (ICD-305.1) He understands the need to quit all tobacco products.  Problem # 3:  CAROTID STENOSIS (ICD-433.10) He is due to have carotid Dopplers this month and I will schedule  these.  Problem # 4:  CORONARY ATHEROSCLEROSIS NATIVE CORONARY ARTERY (ICD-414.01)  He is having no new symptoms. No further cardiovascular testing is suggested.he needs continued risk reduction.  Orders: EKG w/ Interpretation (93000)  Problem # 5:  HYPERTENSION (ICD-401.9) I reviewed a blood pressure diary. His readings areup and down but I think on average control. Rather than adding medications I would ask him to lose weight and suggested a weight watcher's approach.  Problem # 6:  HYPERLIPIDEMIA (ICD-272.4) I reviewed the last 2 lipid profiles. His LDLs have been extremely low. Given this and the new FDA  black box warning discussing drug interactions with amlodipine and Zocor I will reduce him to 10 mg of Zocor daily.  Patient Instructions: 1)  Your physician recommends that you schedule a follow-up appointment in: 6 months with Dr Antoine Poche 2)  Your physician recommends that you return for lab work on samde day as echo/carotid doppler.  Fasting  lipid and liver profile 272.0 v58.69 3)  Your physician has recommended you make the following change in your medication: Decrease Simvastatin to 10 mg every evening 4)  Your physician has requested that you have an echocardiogram.  Echocardiography is a painless test that uses sound waves to create images of your heart. It provides your doctor with information about the size and shape of your heart and how well your heart's chambers and valves are working.  This procedure takes approximately one hour. There are no restrictions for this procedure. 5)  You have been diagnosed with atrial flutter. Atrial flutter is a condition in which one of the upper chambers of the heart has extra electrical cells causing it to beat very fast. Please see the handout/brochure given to you today for further information. Prescriptions: SIMVASTATIN 10 MG TABS (SIMVASTATIN) ONE DAILY  #90 x 3   Entered by:   Charolotte Capuchin, RN   Authorized by:   [name],  MD, [facility]   Signed by:   Charolotte Capuchin, RN on 01/02/2010   Method used:   Print then Give to Patient   RxID:   [id number] SIMVASTATIN 10 MG TABS (SIMVASTATIN) ONE DAILY  #90 x 3   Entered by:   Charolotte Capuchin, RN   Authorized by:   [name], MD, [facility]   Signed by:   Charolotte Capuchin, RN on 01/02/2010   Method used:   Handwritten   RxID:   [id number] SIMVASTATIN 10 MG TABS (SIMVASTATIN) ONE DAILY  #90 x 3   Entered by:   Charolotte Capuchin, RN   Authorized by:   [name], MD, [facility]   Signed by:   Charolotte Capuchin, RN on 01/02/2010   Method used:   Handwritten   RxID:   [id number] SIMVASTATIN 10 MG TABS (SIMVASTATIN) ONE DAILY  #90 x 3   Entered by:   Charolotte Capuchin, RN   Authorized by:   [name], MD, [facility]   Signed by:   Charolotte Capuchin, RN on 01/02/2010   Method used:   Electronically to        [id number] (retail)       [address]       [place], [place]  [address]       Ph: [phone]       Fax: [phone]   RxID:   [id number]

## 2010-08-24 NOTE — Miscellaneous (Signed)
  Clinical Lists Changes  Observations: Added new observation of US CAROTID: Essentially stable, moderate carotid artery disease, bilaterally 40-59% bilateral ICA stenosis Too and fro flow is seen in the right vetebral artery  f/u 1 year [facility] (01/26/2010 10:36) Added new observation of ECHOINTERP:  - Left ventricle: The cavity size was normal. There was mild focal       basal hypertrophy of the septum. Systolic function was normal. The       estimated ejection fraction was in the range of 55% to 60%. Wall       motion was normal; there were no regional wall motion       abnormalities.     - Aortic valve: Trivial regurgitation. Valve area: 2.35cm 2(VTI).       Valve area: 2.71cm 2 (Vmax).     - Mitral valve: Calcified annulus. Trivial regurgitation.     - Left atrium: The atrium was mildly dilated.     - Right ventricle: Systolic function was normal.     - Tricuspid valve: Mild regurgitation.     - Pulmonary arteries: Systolic pressure was within the normal range.     - Inferior vena cava: The vessel was normal in size. (01/26/2010 10:31)      Echocardiogram  Procedure date:  01/26/2010  Findings:       - Left ventricle: The cavity size was normal. There was mild focal       basal hypertrophy of the septum. Systolic function was normal. The       estimated ejection fraction was in the range of 55% to 60%. Wall       motion was normal; there were no regional wall motion       abnormalities.     - Aortic valve: Trivial regurgitation. Valve area: 2.35cm 2(VTI).       Valve area: 2.71cm 2 (Vmax).     - Mitral valve: Calcified annulus. Trivial regurgitation.     - Left atrium: The atrium was mildly dilated.     - Right ventricle: Systolic function was normal.     - Tricuspid valve: Mild regurgitation.     - Pulmonary arteries: Systolic pressure was within the normal range.     - Inferior vena cava: The vessel was normal in size.  Carotid Doppler  Procedure date:   01/26/2010  Findings:      Essentially stable, moderate carotid artery disease, bilaterally 40-59% bilateral ICA stenosis Too and fro flow is seen in the right vetebral artery  f/u 1 year [employer]

## 2010-08-24 NOTE — Assessment & Plan Note (Signed)
Summary: per check out/sf   Visit Type:  Follow-up Primary Anthoney Sheppard:  Cindee Salt MD  CC:  CAD.  History of Present Illness: The patient presents for 6 followup. Since I last saw him he has had no new cardiovascular complaints. He does still get some chest discomfort but it is quite atypical. It lasted for 15-25 seconds at a time and is right upper chest. It is not like previous angina. He takes something for her indigestion is having this. He has been doing a little exercise for instance at the gym on a bicycle and this does not bring on the discomfort. He has not been short of breath and denies any PND or orthopnea. He does have bilateral joint pain that limits him somewhat. Unfortunately he has gained weight. He also had some lower extremity swelling 2 weeks ago started on Lasix. He brings a blood pressure diary today that demonstrates good blood pressure control in the morning but he is in the 170s 180 systolic in the evening and afternoon.   Current Medications (verified): 1)  Nortriptyline Hcl 25 Mg  Caps (Nortriptyline Hcl) .... 2 Daily 2)  Niaspan 1000 Mg Tbcr (Niacin (Antihyperlipidemic)) .Marland Kitchen.. 1 Daily 3)  Citalopram Hydrobromide 40 Mg Tabs (Citalopram Hydrobromide) .... 1/2 By Mouth Daily 4)  Metoprolol Tartrate 50 Mg Tabs (Metoprolol Tartrate) .... 3 Tabs Twice A Day 5)  Simvastatin 40 Mg Tabs (Simvastatin) .... 1/2 By Mouth Daily 6)  Omeprazole 20 Mg Cpdr (Omeprazole) .... Take 1 Tablet By Mouth Two Times A Day 7)  Acetaminophen 325 Mg Tabs (Acetaminophen) .... 3 Every 6 Hours As Needed 8)  Clonazepam Odt 0.5 Mg  Tbdp (Clonazepam) .... Take 2 By Mouth At Bedtime 9)  Norvasc 10 Mg  Tabs (Amlodipine Besylate) .... Take 1/2 By Mouth Two Times A Day 10)  Isosorbide Dinitrate 20 Mg Tabs (Isosorbide Dinitrate) .... 2 By Mouth Three Times A Day 11)  Clonidine Hcl 0.1 Mg Tabs (Clonidine Hcl) .... 3 By Mouth Two Times A Day 12)  Lantus Solostar 100 Unit/ml  Soln (Insulin Glargine)  .... Uad 13)  Lisinopril-Hydrochlorothiazide 20-12.5 Mg  Tabs (Lisinopril-Hydrochlorothiazide) .... Two Times A Day Tab 14)  Centrum Silver   Tabs (Multiple Vitamins-Minerals) .... Take 1 Tablet By Mouth Once A Day 15)  Prazosin Hcl 5 Mg Caps (Prazosin Hcl) .... Take One Tablet By Mouth Once Daily. 16)  Novolin R 100 Unit/ml Soln (Insulin Regular Human) .... Uad 17)  Pramipexole Dihydrochloride 0.125 Mg Tabs (Pramipexole Dihydrochloride) .... Take One Tablet By Mouth Once Daily. 18)  Aspirin Ec 325 Mg Tbec (Aspirin) .... Take One Tablet By Mouth Daily 19)  Tetracycline Hcl 250 Mg Caps (Tetracycline Hcl) .Marland Kitchen.. 1 By Mouth Q3-4 Days 20)  Precision Xtra Test Strips .... Tests Two Times A Day 21)  Furosemide 20 Mg Tabs (Furosemide) .Marland Kitchen.. 1 By Mouth Daily  Allergies (verified): 1)  * Plavix  Past History:  Past Medical History: Reviewed history from 01/02/2010 and no changes required. Colonic polyps, hx of Coronary artery disease (left main was normal, the LAD had a 50-60% stenosis in the mid vessel   and 60-70%                    stenosis in the distal vessel.  The first diagonal had an ostial 95% stenosis as it emerged from  previous stented area.  The circumflex at 40% stenosis in the mid circumflex.  The right coronary                     artery had PDA with 90% stenosis, which has progressed slightly from previous.)   Diverticulosis, colon GERD Hyperlipidemia Hypertension Osteoarthritis--hands/knees Benign prostatic hypertrophy Rosacea Restless legs syndrome E.D. Sleep apnea PTSD NIDDM Carotid artery stenosis   CONSULTANTS Dr Lollie Marrow  [phone]  Past Surgical History: Reviewed history from 12/29/2008 and no changes required. 1981  R knee surgery  Review of Systems       As stated in the HPI and negative for all other systems.   Vital Signs:  Patient profile:   70 year old male Height:      72 inches Weight:      267 pounds BMI:      36.34 Pulse rate:   60 / minute Resp:     18 per minute BP sitting:   128 / 64  (right arm)  Vitals Entered By: Marrion Coy, CNA (June 22, 2010 8:43 AM)  Physical Exam  General:  Well developed, well nourished, in no acute distress. Head:  normocephalic and atraumatic Eyes:  PERRLA/EOM intact; conjunctiva and lids normal. Mouth:  Poor dentition  . Oral mucosa normal. Neck:  Neck supple, no JVD. No masses, thyromegaly or abnormal cervical nodes. Chest Wall:  no deformities  Lungs:  Clear bilaterally to auscultation and percussion. Abdomen:  Bowel sounds positive; abdomen soft and non-tender without masses, organomegaly, or hernias noted. No hepatosplenomegaly, obese Msk:  Back normal, normal gait. Muscle strength and tone normal. Extremities:  Mbilateral lower extremity edema Neurologic:  Alert and oriented x 3. Skin:  Intact without lesions or rashes. Cervical Nodes:  no significant adenopathy Inguinal Nodes:  no significant adenopathy Psych:  Normal affect.   Detailed Cardiovascular Exam  Neck    Carotids: Carotids full and equal bilaterally, bilateral bruits left greater than right     Neck Veins: Normal, no JVD.    Heart    Inspection: no deformities or lifts noted.      Palpation: normal PMI with no thrills palpable.      Auscultation: S1 and S2 within normal limits, no S3, no S4, no clicks, no rubs, 306 apical systolic murmur radiating out aortic outflow tract  Vascular    Abdominal Aorta: no palpable masses, pulsations, or audible bruits.      Femoral Pulses: normal femoral pulses bilaterally.      Pedal Pulses: normal pedal pulses bilaterally.      Radial Pulses: normal radial pulses bilaterally.      Peripheral Circulation: no clubbing, cyanosis   EKG  Procedure date:  06/22/2010  Findings:      Sinus rhythm, rate 60, axis within normal limits, intervals within normal limits, no acute ST-T wave changes  Impression & Recommendations:  Problem # 1:   CORONARY ATHEROSCLEROSIS NATIVE CORONARY ARTERY (ICD-414.01) He is having no angina and will be with risk reduction Orders: EKG w/ Interpretation (93000)  Problem # 2:  AORTIC VALVE DISORDERS (ICD-424.1) His last echo was 2009. I do not suggest progression of his mild  Problem # 3:  OBESITY, UNSPECIFIED (ICD-278.00) I have prescribed the [facility] Diet  Problem # 4:  HYPERLIPIDEMIA (ICD-272.4) I will switch him to Lipitor per FDA recommendations  Problem # 5:  HYPERTENSION (ICD-401.9) I will be on hydralazine coverage as he is not at target with his daytime  blood pressures. Orders: TLB-BMP (Basic Metabolic Panel-BMET) (80048-METABOL)  Patient Instructions: 1)  Your physician recommends that you schedule a follow-up appointment in: 6 months with Dr Antoine Poche 2)  Your physician recommends that you have lab work today:  BMP 3)  Your physician has recommended you make the following change in your medication: Start Hydralazine 25 mg twice a day and Lipitor 20 mg once a day 4)  [place] Diet Prescriptions: LIPITOR 20 MG TABS (ATORVASTATIN CALCIUM) one daily  #30 x 6   Entered by:   Charolotte Capuchin, RN   Authorized by:   [name], MD, [facility]   Signed by:   Charolotte Capuchin, RN on 06/22/2010   Method used:   Electronically to        [id number] [address] (retail)       [address], [address]       [place], [place]  [address]       Ph: [phone]       Fax: [phone]   RxID:   [id number] HYDRALAZINE HCL 25 MG TABS (HYDRALAZINE HCL) twice a day  #60 x 11   Entered by:   Charolotte Capuchin, RN   Authorized by:   [name], MD, [facility]   Signed by:   Charolotte Capuchin, RN on 06/22/2010   Method used:   Electronically to        [facility] [address] (retail)       [address], [address]       [place], [place]  [address]       Ph: [phone]       Fax: [phone]   RxID:    [id number]  I have reviewed and approved all prescriptions at the time of this visit. [name], MD, [facility]  June 22, 2010 9:53 AM

## 2010-08-24 NOTE — Assessment & Plan Note (Signed)
Summary: using bathroom alot/alc   Vital Signs:  Patient profile:   70 year old male Weight:      258 pounds Temp:     98.3 degrees F oral BP sitting:   160 / 70  (left arm) Cuff size:   large  Vitals Entered By: Mervin Hack CMA Duncan Dull) (December 26, 2009 11:59 AM) CC: using bathroom alot   History of Present Illness: having trouble urinating Has to go frequently Repetitive nocturia--has actually had some leakage in bed Marked urgency and dribbling Has been on the prazosin  Does have some burning Looks like there "are streaks in it"  sugar control is about the same Last HgbA1c 8.1% in May had bladder scan done in [place] and had no retention  sugars have been fairly good generally  ~160 fasting if he is right with his eating  Allergies: 1)  * Plavix  Past History:  Past medical, surgical, family and social histories (including risk factors) reviewed for relevance to current acute and chronic problems.  Past Medical History: Colonic polyps, hx of Coronary artery disease (left main was normal, the LAD had a 50-60% stenosis in the mid vessel   and 60-70%                    stenosis in the distal vessel.  The first diagonal had an ostial 95% stenosis as it emerged from                            previous stented area.  The circumflex at 40% stenosis in the mid circumflex.  The right coronary                     artery had PDA with 90% stenosis, which has progressed slightly from previous.)   Diverticulosis, colon GERD Hyperlipidemia Hypertension Osteoarthritis--hands/knees Benign prostatic hypertrophy Rosacea Restless legs syndrome E.D. Sleep apnea PTSD NIDDM   CONSULTANTS Dr Lollie Marrow  [phone]  Past Surgical History: Reviewed history from 12/29/2008 and no changes required. 1981  R knee surgery  Family History: Reviewed history from 12/29/2008 and no changes required.  Mother died of MI at age 16.  Father died of MI at age   29.  He had a brother who  died of rheumatoid fever, and subsequently   diabetes and syncope.  He has another brother who has a history of CAD   and CABG.      Social History: Reviewed history from 12/29/2008 and no changes required. Lives in [place] with his wife, and he is a retired   [employer].  He has a 40-pack-year history of tobacco abuse, quitting   about 25 years ago.  Denies alcohol or drug use.  Does exercise daily in   the gym, using a stationary bike and light weights.      Review of Systems       ongoing knee pain--doesn't want to intervene no nausea or vomiting  Physical Exam  General:  alert and normal appearance.   Abdomen:  soft and non-tender.   No suprapubic dullness Rectal:  no hemorrhoids and no masses.   Prostate:  no gland enlargement, no nodules, and no asymmetry.  No sig tenderness   Impression & Recommendations:  Problem # 1:  FREQUENCY, URINARY (ICD-788.41) Assessment New  symptoms sound like worsening of BPH but relatively recent and had bladder scan 1 month ago that showed no retention could be  prostate infection but no tenderness doubt this is due to diabetes though he is spilling sugar urine  P: will try antibiotic for 3 weeks    consider adding finasteride if symptoms don't improve  His updated medication list for this problem includes:    Prazosin Hcl 5 Mg Caps (Prazosin hcl) .Marland Kitchen... Take one tablet by mouth once daily.  Orders: UA Dipstick w/o Micro (manual) ([address])  Complete Medication List: 1)  Nortriptyline Hcl 25 Mg Caps (Nortriptyline hcl) .... 2 daily 2)  Niaspan 1000 Mg Tbcr (Niacin (antihyperlipidemic)) .Marland Kitchen.. 1 daily 3)  Citalopram Hydrobromide 40 Mg Tabs (Citalopram hydrobromide) .Marland Kitchen.. 1 once daily 4)  Metoprolol Tartrate 50 Mg Tabs (Metoprolol tartrate) .... 3 tabs twice a day 5)  Simvastatin 40 Mg Tabs (Simvastatin) .... 1/2 daily 6)  Omeprazole 20 Mg Cpdr (Omeprazole) .... Take 1 tablet by mouth two times a day 7)  Acetaminophen 325 Mg Tabs  (Acetaminophen) .... 3 every 6 hours as needed 8)  Clonazepam Odt 0.5 Mg Tbdp (Clonazepam) .... Take 2 by mouth at bedtime 9)  Norvasc 10 Mg Tabs (Amlodipine besylate) .... Take 1/2 by mouth two times a day 10)  Isosorbide Dinitrate 20 Mg Tabs (Isosorbide dinitrate) .... 2 by mouth three times a day 11)  Clonidine Hcl 0.1 Mg Tabs (Clonidine hcl) .... 3 by mouth three times a day 12)  Lantus Solostar 100 Unit/ml Soln (Insulin glargine) .... Uad 13)  Lisinopril-hydrochlorothiazide 20-12.5 Mg Tabs (Lisinopril-hydrochlorothiazide) .... Two times a day tab 14)  Centrum Silver Tabs (Multiple vitamins-minerals) .... Take 1 tablet by mouth once a day 15)  Prazosin Hcl 5 Mg Caps (Prazosin hcl) .... Take one tablet by mouth once daily. 16)  Novolin R 100 Unit/ml Soln (Insulin regular human) .... Uad 17)  Pramipexole Dihydrochloride 0.125 Mg Tabs (Pramipexole dihydrochloride) .... Take one tablet by mouth once daily. 18)  Aspirin Ec 325 Mg Tbec (Aspirin) .... Take one tablet by mouth daily 19)  Tetracycline Hcl 250 Mg Caps (Tetracycline hcl) .Marland Kitchen.. 1 by mouth q3-4 days 20)  Precision Xtra Test Strips  .... Tests two times a day 21)  Ciprofloxacin Hcl 250 Mg Tabs (Ciprofloxacin hcl) .Marland Kitchen.. 1 tab by mouth two times a day for prostate infection  Patient Instructions: 1)  Please call if urinary symptoms have not improved after the antibiotic. We will need to add another medicine for your prostate Prescriptions: CIPROFLOXACIN HCL 250 MG TABS (CIPROFLOXACIN HCL) 1 tab by mouth two times a day for prostate infection  #42 x 0   Entered and Authorized by:   Cindee Salt MD   Signed by:   Cindee Salt MD on 12/26/2009   Method used:   Electronically to        [id number] (retail)       [address]       [place], [place]  [address]       Ph: [phone]       Fax: [phone]   RxID:   [id number]   Current Allergies (reviewed today): * PLAVIX  Laboratory Results   Urine  Tests  Date/Time Received: December 26, 2009 12:20 PM Date/Time Reported: December 26, 2009 12:20 PM  Routine Urinalysis   Color: yellow Appearance: Hazy Glucose: >=1000   (Normal Range: Negative) Bilirubin: negative   (Normal Range: Negative) Ketone: negative   (Normal Range: Negative) Spec. Gravity: 1.015   (Normal Range: 1.003-1.035) Blood: trace-lysed   (Normal Range: Negative) pH: 6.0   (  Normal Range: 5.0-8.0) Protein: negative   (Normal Range: Negative) Urobilinogen: 0.2   (Normal Range: 0-1) Nitrite: negative   (Normal Range: Negative) Leukocyte Esterace: negative   (Normal Range: Negative)     Blood Tests     HGBA1C: 8.1%   (Normal Range: Non-Diabetic - 3-6%   Control Diabetic - 6-8%)

## 2010-08-24 NOTE — Miscellaneous (Signed)
  Clinical Lists Changes  Observations: Added new observation of RS STUDY: TRACER - study completion 08/09/09 (10/18/2009 11:20)      Research Study Name: TRACER - study completion 08/09/09

## 2010-08-24 NOTE — Progress Notes (Signed)
Summary: Echo results  Phone Note Call from Patient Call back at Home Phone [phone]   Caller: Patient Summary of Call: echo results Initial call taken by: Judie Grieve,  February 03, 2010 10:58 AM  Follow-up for Phone Call        called with results Follow-up by: Charolotte Capuchin, RN,  February 03, 2010 11:18 AM

## 2010-08-24 NOTE — Miscellaneous (Signed)
Summary: Orders Update  Clinical Lists Changes  Problems: Added new problem of CAROTID ARTERY DISEASE (ICD-433.10) Orders: Added new Test order of Carotid Duplex (Carotid Duplex) - Signed 

## 2010-12-05 NOTE — Cardiovascular Report (Signed)
NAME:  [name] Ball, [name] Ball NO.:  [id number]   MEDICAL RECORD NO.:  [id number]          PATIENT TYPE:  INP   LOCATION:  2917                         FACILITY:  [facility]   PHYSICIAN:  Verne Carrow, MDDATE OF BIRTH:  1941/05/11   DATE OF PROCEDURE:  04/12/2008  DATE OF DISCHARGE:                            CARDIAC CATHETERIZATION   INDICATION:  Chest pain with exertion in a patient with known coronary  artery disease.   OPERATOR:  Verne Carrow, MD   PROCEDURES PERFORMED:  1. Left heart catheterization  2. Selective coronary angiography.  3. Left ventricular pressure measurement.   DETAILS OF PROCEDURE:  The patient was brought to the Heart  Catheterization Laboratory after signing informed consent.  The right  groin was prepped and draped in a sterile fashion.  A 5-French sheath  was inserted into the right femoral artery.  Standard diagnostic  catheters were used to perform selective coronary angiography.  A 5-  French pigtail catheter was used to cross the aortic valve into the left  ventricle.  No left ventricular angiogram was performed secondary to  elevated end-diastolic pressures.  The pigtail catheter was pulled back  across the aortic valve with no significant pressure gradient measured.  The patient tolerated the procedure well and was taken to the recovery  area where his sheath was removed.  Manual pressure was applied for  hemostasis.   ANGIOGRAPHIC FINDINGS:  1. The left main coronary artery was short and had no evidence of      disease.  This bifurcated into the left anterior descending and the      circumflex.  2. The left anterior descending had luminal irregularities in the      proximal portion.  The stented segment of the mid LAD showed no      significant restenosis.  There was a 50-60% stenosis in the mid LAD      as well as a 60-70% stenosis in the more distal portion of the LAD.      The first diagonal branch is a large vessel  that has a 95% ostial      stenosis that appears to have been created when the stent was      placed and jailed this vessel.  This is unchanged in appearance      from the last catheterization from December 2008.  3. The circumflex gives rise to a large obtuse marginal branch that      has no evidence of disease.  There is a 40% stenosis in the mid      circumflex just distal to the take off of the obtuse marginal      branch.  4. The right coronary artery has luminal irregularities in the      proximal portion.  The stented segment of the distal right coronary      artery has no restenosis noted.  There is a posterior descending      branch that has a 90% stenosis.  When comparing to the last      catheterization from December 2008, this has progressed from 80%  then to approximately 90% now.  5. No left ventricular angiogram was performed secondary to the      patient's elevated end-diastolic pressures.  6. Hemodynamic data, the LV pressure 178/24, end-diastolic pressure      41, central aortic pressure 188/87.   IMPRESSION:  1. Double-vessel coronary artery disease without significant      progression since last catheterization 9 months ago.  2. Elevated left ventricular filling pressures.   RECOMMENDATIONS:  The patient does have diffuse coronary artery disease  that has not changed significantly in the last 9 months since his last  heart catheterization.  He could certainly be having pain from the small  posterior descending branch or from the ostial stenosis of the diagonal  branch that was jailed during the stent placement in the mid LAD.  I  would recommend at this time getting a stress Myoview to define and  localize his areas of ischemia.  I think it would be reasonable if there  are areas of ischemia on the Myoview to proceed then with percutaneous  coronary intervention.  Of note, the patient is intolerant to PLAVIX,  but has tolerated Ticlid in the past.  He does  continue to have mild  anemia, but had negative GI workup a year ago.      Verne Carrow, MD  Electronically Signed     CM/MEDQ  D:  04/12/2008  T:  04/13/2008  Job:  [id number]   cc:   [name], MD, [facility]

## 2010-12-05 NOTE — H&P (Signed)
NAME:  [name] Ball, PETITE NO.:  [id number]   MEDICAL RECORD NO.:  [id number]          PATIENT TYPE:  EMS   LOCATION:  MAJO                         FACILITY:  [facility]   PHYSICIAN:  Christell Faith, MD   DATE OF BIRTH:  Jan 06, 1941   DATE OF ADMISSION:  05/26/2007  DATE OF DISCHARGE:                              HISTORY & PHYSICAL   CHIEF COMPLAINT:  Chest pain and high blood pressure.   HISTORY OF PRESENT ILLNESS:  This is a very pleasant 70 year old white  man who lives outside of [place].  He does have a history of  obstructive coronary artery disease and is status post percutaneous  intervention by Dr. Riley Kill in 2002 for progressive angina.  For the  past month or so, he has had angina progressive in both severity and  frequency.  Two weeks ago, his HCTZ and lisinopril were discontinued by  his [place] physician for concern of an elevated creatinine and since then,  he has noticed his blood pressure has been significantly higher.  He  checks his blood pressure several times throughout the day at home.  Not  surprisingly, his angina has intensified over this same time period.  His blood pressure is somewhat difficult to manage because he is  actually hypotensive and orthostatic in the morning.  He says he usually  arises, takes all of his medicines, and then drives to [place], and  by the time he gets there, he is very dizzy and very light-headed.  This  gradually improves throughout the morning and resolves by the afternoon.  He also goes to the gym every day and feels dizzy and light-headed after  working out.  He denies syncope.  He does get short of breath with his  angina.  The angina is classic, retrosternal pressure brought on with  exertion and relieved with nitroglycerin.  He has been taking  nitroglycerin more often.  He took two yesterday and one the day before.  Previously, he was using it about once a week.  He denies orthopnea or  PND.   PAST  MEDICAL HISTORY:  1. Progressive angina 2002 status post bare-metal stent to the LAD.      Residual stenosis of diagonal 60 to 70%.  Most recent      catheterization was May of 2007 after a Cardiolite scan suggested      possible apical ischemia.  This revealed 10% proximal LAD lesion,      70 to 80% first diagonal which was jailed by the LAD stent, 30%      proximal RCA lesion 60% in the mid PDA.  2. Last echo was January 2008.  It revealed an ejection fraction of 65      to 70%.  Mild aortic valve stenosis.  Mild aortic root dilatation.  3. Diabetes.  4. Apparently, he had an abnormal creatinine at the [place] two to three      weeks ago, but this is apparently resolved today.  5. Hypertension.  6. Lower-extremity edema, worse over the past two to three weeks.  7. Osteoarthritis.  8. History of  knee surgery.  9. BPH.  10.Obstructive sleep apnea.   SOCIAL HISTORY:  He lives outside of [place] with his wife.  He is a  retired Naval architect.  He is a Cytogeneticist and receives significant primary  care at the [place] in [place].  He quit smoking 25 years ago but previously  had a 40-pack-year history.  He use to drink two to three beers a day  but quit this three months ago.   FAMILY HISTORY:  Mother died in her 53s of an MI.  He has an uncle who  died of a stroke in his 18s.  Father was an alcoholic and died in his  29s.  He has two healthy children.   ALLERGIES:  PLAVIX APPARENTLY CAUSED A BACKACHE.  IT IS UNCLEAR IF THIS  REPRESENTS A TRUE ALLERGY.   MEDICATIONS:  1. Celexa 40 mg p.o. daily.  2. Klonopin p.r.n.  3. Norvasc 10 mg p.o. daily.  4. Glipizide 5 mg p.o. b.i.d.  5. Isosorbide dinitrate 10 mg p.o. t.i.d.  6. Lopressor 50 mg p.o. b.i.d.  7. Niaspan 1000 mg p.o. q.h.s.  8. Nortriptyline 50 mg p.o. q.h.s.  9. Omeprazole 20 mg p.o. b.i.d.  10.Prazosin 50 mg p.o. q.h.s.=.  11.Zocor 40 mg p.o. daily.  12.Aspirin 325 mg p.o. daily.   RECENTLY DISCONTINUED MEDICINES:  1. HCTZ 25 mg  p.o. daily.  2. Lisinopril 40 mg p.o. daily.  3. Metformin 850 mg p.o. t.i.d.  4. Ibuprofen.  5. Naltrexone.   REVIEW OF SYSTEMS:  EXTREMITIES:  Positive for lower-extremity edema.  CARDIAC:  Positive for fatigue, angina, exertional shortness of breath.  GI:  He had one episode of bright-red blood while constipated but no  further GI bleeding.  No stroke or TIA.  No palpitations.  No syncope  but positive for pre-syncope as described above.  Otherwise, the balance  of 12 systems is reviewed in detail and is negative except for what is  mentioned above.   PHYSICAL EXAMINATION:  VITAL SIGNS:  Temperature 98.6, initial blood  pressure 229/107, repeat 189/90, pulse 73, saturation 98% on room air,  respiratory rate 18.  GENERAL:  This is a pleasant, obese, white man in no distress.  HEENT:  Pupils are round and reactive.  Sclerae are clear.  Extraocular  movements are intact.  Mucous membranes are moist.  NECK:  Supple.  Neck veins are flat.  There are no true carotid bruits  but rather radiation of heart sounds.  LUNGS:  Clear to auscultation bilaterally without wheezing or rales.  CARDIAC EXAM:  Normal rate, regular rhythm.  There is a harsh late-  peaking systolic ejection murmur best heard at the base of the heart  consistent with aortic stenosis or sclerosis.  There is also a soft-  blowing murmur at the base.  ABDOMEN:  Obese, soft, nontender, nondistended.  EXTREMITIES:  Reveal 1+ bilateral pitting edema.  SKIN:  Complexion is reddy in the face with brawny skin changes of the  lower extremities.  Distal pulses are intact.  No clubbing or cyanosis.  NEUROLOGIC EXAM:  Grossly intact.  All four extremities have 5/5  strength.   DIAGNOSTIC TESTING:  Electrocardiogram reveals normal sinus rhythm with  a rate of 74.  No significant ST or T-wave changes and unchanged when  compared to prior.   LABORATORY DATA:  White blood cell 7.5, hemoglobin 10.3, platelets 145,  sodium 136,  potassium 3.7, BUN 22, creatinine 1.0, glucose 165.  CK MB  3.6,  troponin-I less than 0.05, INR 1.1.   IMPRESSION:  A 70 year old white man with a history of coronary disease,  now presenting with severe hypertension and progressive angina.   PLAN:  1. We will plan to admit the patient to step-down unit on nitro gtt.  2. Rule out myocardial infarction by cycling serial EKGs and cardiac      enzymes.  3. The patient's kidney function appears normal today.  It is noted      that he was taking a great deal of ibuprofen and that may have been      the etiology of his increased creatinine at the [facility]      two to three weeks ago.  In any regard, clearly an ACE inhibitor      would be beneficial in this hypertensive diabetic gentleman.  We      will restart his lisinopril and we will also restart his      hydrochlorothiazide as he is having considerable problems with      hypertension.  We will follow his electrolytes closely including      sodium, potassium and creatinine.  4. The chest pain is probably being driven primarily by his      hypertension.  He is known to have borderline obstructive disease      in particular in a diagonal artery.  I have not reviewed the films,      but this would probably not be a lesion that would be very amenable      to percutaneous intervention given that this vessel was jailed by      the LAD stent originally placed in 2002.  Therefore, good medical      management, including much better blood pressure control, is      probably the best therapy.  5. Likewise, he will need strict diabetes control, and again, given      that his kidney function is normal, we will plan to reinitiate      metformin upon discharge from the hospital.  6. Dr. Antoine Poche may wish to further evaluate the chest pain with a      stress test or a cardiac catheterization.  Therefore, we will make      the patient NPO after midnight tonight.  7. He has a heart murmur on  examination which correlates well with the      findings of his previous echo of aortic sclerosis.  8. Lower-extremity edema is probably related to dihydropyridine      calcium channel blocker as well as recent discontinuation of blood      pressure medicines.  His lungs are clear and there is no clinical      evidence of congestive heart failure.  9. Continue CPAP, especially given his difficult to treat hypertension      and now with lower-extremity edema.  He may have to have his      setting re-titrated at some point if there are continued signs of      pulmonary hypertension.  10.Obesity is a problem for this gentleman and weight loss would      probably allow for much better blood pressure and diabetes control.  11.His pre-syncope in the morning is probably related to taking all of      his blood pressure medicines at the same time in the morning.  What      we would like him to do is space his medicines out throughout the  day and we will work on this throughout his hospitalization and      upon discharge.  For now, we will formally check orthostatics in      the morning and make changes as needed.      Christell Faith, MD  Electronically Signed     NDL/MEDQ  D:  05/27/2007  T:  05/27/2007  Job:  [id number]   cc:   [name], MD, [facility]  [name], MD

## 2010-12-05 NOTE — H&P (Signed)
NAME:  [name] Ball, [name] NO.:  [id number]   MEDICAL RECORD NO.:  [id number]          PATIENT TYPE:  INP   LOCATION:  1825                         FACILITY:  [facility]   PHYSICIAN:  [name], MD, FACCDATE OF BIRTH:  January 07, 1941   DATE OF ADMISSION:  04/21/2008  DATE OF DISCHARGE:                              HISTORY & PHYSICAL   PRIMARY CARDIOLOGIST:  [name], MD, [facility]   PRIMARY CARE [name]:  [name] Schwalbe, MD   PATIENT PROFILE:  A 70 year old black Caucasian male with prior history  of CAD, status post recent catheterization, who presents with recurrent  chest pain.   1. Unstable angina/CAD.  1a.  On September 17, 2000, PCI and stenting of the LAD with placement of  a 3.5 x 20 mm radius bare-metal stent.  PTCA of the distal LAD.  1b.  On December 24, 2000, PCI of the LAD secondary to in-stent restenosis.  The diagonal was also balloon angioplastied.  1c.  On June 27, 2007, PCI of the distal RCA with placement of a 3.5  x 12 mm Liberte bare-mental status.  1d.  On April 12, 2008, cardiac catheterization left main normal.  LAD stent patent, 50-60% mid, 60-70% distal.  D1 95% ostial/jailed.  Left circumflex is 40% mid.  RCA stent is patent.  PDA 90%.  1. Anemia.  2. Hypertension.  3. Mild aortic stenosis.  4. Diabetes mellitus.  5. BPH.  6. Obstructive sleep apnea.  7. Remote tobacco abuse.  8. History of knee surgery.   HISTORY OF PRESENT ILLNESS:  A 70 year old married Caucasian male with  history of CAD, status post PCI of the LAD diagonal and distal RCA with  most recent catheterization on April 12, 2008, revealing patent LAD  and RCA stents with a jailed diagonal (95%) and progression of PDA  disease (90%).  It was felt medical therapy was appropriate, but that  the patient would benefit from Myoview to evaluate for focal ischemia  and determine if he would benefit from PCI either of the 2 sites.  He  was in his usual state of health  until today when at approximately 1:00  p.m., he was resting and developed 8/10 substernal chest heaviness  without associated symptoms.  He normally gets shortness of breath with  his chest pain.  He took a nitroglycerin without relief and then a  second nitroglycerin on the way to the ED.   At the time he arrived here, approximately 90 minutes after onset, chest  pain was 0/10.  He was markedly hypertensive upon arrival with blood  pressure of 193/79.  His pressure is now in the 170s.  He is currently  pain free.   ALLERGIES:  PLAVIX causes rash.   HOME MEDICATIONS:  1. Lopressor 100 mg b.i.d.  2. Tracer study drug daily.  3. Metformin 850 mg t.i.d.  4. Clonidine 0.2 mg b.i.d.  5. Imdur 120 mg daily.  6. Nitroglycerin 0.4 mg p.r.n.  7. Lantus 45 units nightly.  8. Clonazepam 0.5 mg nightly.  9. Nortriptyline unknown dose.  10.Mirapex 0.125 mg nightly.  11.Doxazosin 4 mg nightly.  12.Aspirin 325 mg daily.  13.Niaspan 1000 mg nightly.  14.Lisinopril and hydrochlorothiazide 20/12.5 mg daily.  15.Omeprazole 20 mg b.i.d.  16.Glipizide 10 mg b.i.d.  17.Zocor 20 mg nightly.  18.Norvasc 5 mg b.i.d.  19.Celexa 40 mg daily.  20.Multivitamin daily.   FAMILY HISTORY:  Mother died of MI at age 24.  Father died of MI at age  88.  He had a brother who died of rheumatoid fever, and subsequently  diabetes and syncope.  He has another brother who has a history of CAD  and CABG.   SOCIAL HISTORY:  Lives in [place] with his wife, and he is a retired  [employer]al architect.  He has a 40-pack-year history of tobacco abuse, quitting  about 25 years ago.  Denies alcohol or drug use.  Does exercise daily in  the gym, using a stationary bike and light weights.   REVIEW OF SYSTEMS:  Positive for chest pain and diabetes.  Otherwise,  all systems reviewed and negative.   PHYSICAL EXAMINATION:  VITAL SIGNS:  Temperature 97.9, heart rate 98,  respirations 21, blood pressure 193/79, and pulse ox 99% on  2 L.  GENERAL:  Pleasant white male, in no acute distress.  Awake, alert, and  oriented x3.  HEENT:  Normal.  NEUROLOGIC:  Grossly intact and nonfocal.  SKIN:  Warm and dry without lesions or masses.  NECK:  No bruits or JVD.  LUNGS:  Respirations are regular and unlabored.  Clear to auscultation.  CARDIAC:  Regular S1 and S2 with a 2/6 systolic ejection murmur at the  right upper sternal border heard throughout and also radiating to the  bilateral carotid.  ABDOMEN:  Round, soft, nontender, and nondistended.  Bowel sounds  present x4.  EXTREMITIES:  Warm, dry, and pink.  No clubbing or cyanosis with trace  bilateral lower extremity edema.  Dorsalis pedis posterior tibial pulses  are 2+ and equal bilaterally.  The right groin site, which was  previously used for cardiac catheterization is mildly ecchymotic,  however, there are no bruits, bleeding, or hematoma.   LABORATORY DATA:  Chest x-ray shows no active disease.  EKG shows sinus  rhythm at a rate of 94 with normal axis.  No acute ST-T changes.  Hemoglobin 10.9, hematocrit 32.3, WBC 8.1, and platelets 170.  Sodium  136, potassium 4.9, chloride 107, CO2 of 21, BUN 23, creatinine 1.2, and  glucose 268.  CK-MB 2.5 and troponin I less than 0.05.   ASSESSMENT AND PLAN:  1. Unstable angina/coronary artery disease.  The patient had angina at      rest today eventually relieved after a second nitroglycerin.  He      was hypertensive in the ED and his heart rates in the 90s.  He has      known small vessel disease involving the diagonal PDA.  We will      plan to admit and cycle cardiac markers.  Add heparin, IV nitro,      and continue his home medications with titration of the beta-      blocker.  Provided that cardiac markers are negative, we will plan      a Myoview in the a.m. to evaluate for ischemia and the diagonal      versus PDA territories with probable PCI to follow.  2. Hypertension.  This is poorly controlled.  The patient  states that      it typically runs in the 170s at home.  We will increase his      metoprolol to 150 b.i.d. as his heart rates are in the 90s here in      the ED.  We will also give him five IV Lopressor here.  We will      alter his Norvasc to 10 mg a day rather then 5 mg b.i.d.  We can      look to titrate his ACE inhibitor and diuretic.  I will rule out IV      nitroglycerin now.  3. Hyperlipidemia.  Continue statin therapy.  4. Anemia, stable.  Follow.  5. Diabetes mellitus.  Continue Lantus and glipizide.  We will hold      off on the metformin as he may be undergoing PCI this admission.      Add sliding scale insulin.      Nicolasa Ducking, ANP      Jesse Sans. Daleen Squibb, MD, [facility]  Electronically Signed    CB/MEDQ  D:  04/21/2008  T:  04/22/2008  Job:  [id number]

## 2010-12-05 NOTE — H&P (Signed)
NAME:  VERLON, PISCHKE NO.:  [id number]   MEDICAL RECORD NO.:  [id number]          PATIENT TYPE:  INP   LOCATION:  2030                         FACILITY:  [facility]   PHYSICIAN:  Vernice Jefferson, MD          DATE OF BIRTH:  December 27, 1940   DATE OF ADMISSION:  06/25/2007  DATE OF DISCHARGE:                              HISTORY & PHYSICAL   CARDIOLOGIST:  [name], MD, [facility]   CHIEF COMPLAINT:  Chest pain and hypertension.   HISTORY OF PRESENT ILLNESS:  The patient is a 70 year old white male  with a history of coronary artery disease who has been recently admitted  for an episode of hypertension with substernal chest pain.  He was  discharged after a left heart catheterization which did not demonstrate  any new obstructive coronary disease.  Did have a gel diagonal from a  prior mid-LAD stent.  Did not need any further interventions on that  hospitalization.  The patient has had his blood pressure medicines  titrated up by Dr. Antoine Poche as an outpatient.  The patient reports that  on his home blood pressure monitoring, he got readings of systolic's  greater than 220 and had some substernal chest pressure associated with  that.  It lasted for 1 minute and went away.  He had no associated  symptoms with it and it has not recurred since then.  The patient got  nervous about the chest pain and high blood pressure and came to the  emergency room.  Since he has been here in the ED, his blood pressures  have not been higher than 150 systolic and he has had no chest pain.   PAST MEDICAL HISTORY:  1. Coronary artery disease status post bare-metal stent to the LAD in      2002, residual ostial diag stenosis most recent heart      catheterization with no significant obstructive coronary disease.  2. Difficult to control hypertension with paroxysmal episodes of high      blood pressure.  3. Diabetes.  4. Chronic lower-extremity edema.  5. BPH.  6. Obstructive sleep apnea.   SOCIAL HISTORY:  He lives outside of [place] with his wife.  Retired  Naval architect.  Quit smoking 25 years ago.  Has a 40-pack-year history.  No current alcohol.  No drug abuse.   FAMILY HISTORY:  Noncontributory with the patient's current medical  condition.   ALLERGIES:  PLAVIX PER THE CHART WHICH CAUSED A BACKACHE.   MEDICATIONS:  Currently include:  1. Nortriptyline 50 mg once a day.  2. Glipizide 10 mg b.i.d.  3. Omeprazole 20 mg b.i.d.  4. Simvastatin 20 mg once a day.  5. Metoprolol was just increased to 100 b.i.d. by Dr. Antoine Poche.  6. Niacin 1 gram daily.  7. Aspirin 325 mg once a day.  8. Lisinopril 20 mg once a day.  9. Hydrochlorothiazide 12.5 mg once a day.  10.Imdur 60 mg once a day.  11.Amlodipine 10 mg daily.   REVIEW OF SYSTEMS:  Negative 11 point review of systems except for  otherwise  dictated in the above HPI.   PHYSICAL EXAMINATION:  VITAL SIGNS:  His blood pressure on my exam was  132/80, heart rate is 65.  He is afebrile breathing 12 times a minute  and 98% on room air.  GENERAL:  Well-developed, well-nourished, white male in no acute  distress.  HEENT:  Moist mucous membranes.  There is a scar noted to the temple  area.  NECK:  Supple.  Full range of motion.  No jugular venous distention.  CARDIOVASCULAR:  Regular rate and rhythm.  No murmurs, rubs or gallops.  CHEST:  Clear to auscultation bilaterally.  No wheezes, rales or  rhonchi.  ABDOMEN:  Soft, nondistended, nontender, normoactive bowel sounds.  EXTREMITIES:  Trace pretibial edema.   MEDICAL DECISION MAKING:  Chest x-ray not obtained here in the ED.   The EKG demonstrates normal sinus rhythm, normal axis and poor R-wave  progression on this EKG that I think is probably more likely due to lead  placement.  There are no acute ST-T changes.   LABORATORY DATA:  Hemoglobin 11.2, BUN and creatinine of 23 and 1.2,  cardiac biomarkers negative x1 set.   IMPRESSION:  1. Unstable angina with a  history of chronic stable angina.  2. Coronary artery disease status post percutaneous coronary      intervention in 2002 with no significant obstructive disease as of      his last catheterization 1 month ago.  3. Hypertension, uncontrolled and labile.  4. Diabetes.  5. Obstructive sleep apnea.   PLAN:  We will admit the patient to Dr. Jenene Slicker service and telemetry  monitoring to rule out for myocardial infarction by serial enzymes.  I  expect this is probably just his uncontrolled hypertension is probably  an etiology, not a true acute coronary syndrome.  We will hold heparin  right now given his biomarker negative.  We will continue on his home  medications.  He probably needs his blood pressure medicines further  titrated up for management of this chronic stable angina, additionally,  have not seen any serum metanephrine's that are ordered and we will do  so just to look for further paroxysmal etiologies of his hypertension.  Dr. Antoine Poche will see him in the morning.  We will keep him NPO for  right now if he chooses to do any further cardiovascular risk  stratification.      Vernice Jefferson, MD  Electronically Signed     JT/MEDQ  D:  06/26/2007  T:  06/26/2007  Job:  [id number]

## 2010-12-05 NOTE — Assessment & Plan Note (Signed)
[facility] OFFICE NOTE   [name] Ball, [name] Ball                      MRN:          [id number]  DATE:01/30/2008                            DOB:          1940/09/11    PRIMARY CARE PHYSICIAN:  [name], MD   REASON FOR PRESENTATION:  Evaluate the patient with coronary artery  disease and hypertension.   HISTORY OF PRESENT ILLNESS:  The patient is a pleasant 70 year old  gentleman with coronary artery disease as described below.  He returns  for followup.  He has done very well since I last saw him.  His blood  pressure has been a little labile as he keeps a blood pressure diary and  brought these results today.  He has had a little trouble with his blood  sugars as well and is having this managed by Dr. Alphonsus Sias and also his  physician at the [facility].  From a cardiovascular standpoint, he has had chest  discomfort.  This happens with exertion.  He will do things such as pull  the hose around his yard and might have to take a nitroglycerin.  He  does not have any resting symptoms.  He describes a substernal  discomfort that is his previous angina.  He thinks it is a stable  pattern, unchanged from the weeks and months before.  He denies any  nausea, vomiting, or diaphoresis.  He has not had any palpitation,  presyncope, or syncope.  He does have some decreased exercise tolerance.  This has been chronic as well.  He is unfortunately gaining weight.  He  seems to be confused about what to eat between his diabetes and his  heart disease.   PAST MEDICAL HISTORY:  Coronary artery disease (status post bare-metal  stent to the LAD in 2002, catheterization in December demonstrated a  left main fairly disease, the LAD stent was patent, there was mid and  distal vessel 70% stenosis, the diagonal came out of stent and had an  ostial 90% stenosis.  The right coronary artery is 70% stenosed.  There  appeared to be a plaque  rupture.  He had bare-metal stent placed to  this), difficulty controlling hypertension, diabetes, obesity, chronic  lower extremity edema, benign prostatic hypertrophy, sleep apnea, mild  renal insufficiency.   ALLERGIES:  PLAVIX caused backache and questionably a rash.   MEDICATIONS:  1. Doxazosin 4 mg at bedtime.  2. Omeprazole 20 mg b.i.d.  3. Metformin 850 mg t.i.d.  4. Citalopram 40 mg daily.  5. Pramipexole 0.125 mg at bedtime.  6. Amlodipine 5 mg b.i.d.  7. Niaspan 1000 mg at bedtime.  8. Simvastatin 40 mg daily.  9. Glipizide 10 mg b.i.d.  10.Lisinopril/HCTZ 20/12.5 b.i.d.  11.Metoprolol 75 mg b.i.d.  12.Centrum.  13.Aspirin 325 mg daily.  14.Tracer study drug.  15.Isosorbide mononitrate 60 mg daily.  16.Clonidine 0.1 mg b.i.d.   REVIEW OF SYSTEMS:  As stated in the HPI and otherwise negative for  other systems.   PHYSICAL EXAMINATION:  GENERAL:  The patient is in no distress.  VITAL SIGNS:  Blood pressure 131/61, heart rate 58 and regular, weight  258 pounds.  HEENT:  Eyelids unremarkable; pupils equal, round, and reactive to  light; fundi not visualized; oral mucosa unremarkable.  NECK:  No jugular venous distention 45 degrees; carotid upstroke brisk  and symmetrical; no bruits, no thyromegaly.  LYMPHATICS:  No cervical, no axillary, no inguinal adenopathy.  LUNGS:  Clear to auscultation bilaterally.  BACK:  No costovertebral angle tenderness.  CHEST:  Unremarkable.  HEART:  PMI not displaced or sustained, S1 and S2 within normal limits.  No S3, no S4, no clicks, no rubs, 2/6 apical systolic murmur radiating  slightly at the aortic outflow tract, no diastolic murmurs.  ABDOMEN:  Obese; positive bowel sounds, normal in frequency and pitch;  no bruits, no rebound,  no guarding, no midline pulsatile mass, no  hepatomegaly, no splenomegaly.  SKIN:  No rashes, no nausea.  EXTREMITIES:  2+ pulses throughout; trace bilateral lower extremity  edema; no cyanosis, no  clubbing.  NEURO:  Oriented to person, place, and time; cranial nerves II-XII are  grossly intact; motor grossly intact.   EKG, sinus rhythm, rate 58, axis within normal limits, intervals within  normal limits, and no acute ST wave changes.   ASSESSMENT AND PLAN:  1. Coronary artery disease.  The patient is having some angina.  He      has taken about 8-10 nitroglycerin in the past month, but says this      is a stable pattern and only happens with exertion.  At this point,      I do not think we need another noninvasive or invasive study.      Rather, I will increase his isosorbide mononitrate to 120 mg daily.      He will let me know if his pattern changes, at which point, I will      have a low threshold for another catheterization.  He will continue      with aggressive risk reduction.  2. Hypertension.  Blood pressure is somewhat labile.  He will need to      concentrate on weight loss.  I would not want to add another      medication at this point.  He is already on polypharmacy.  If he      can concentrate on weight loss and salt restriction, we may achieve      more stable and target blood pressures.  3. Obesity as above.  We discussed this at great length (greater than      half hour in this appointment).  Hopefully, he can comply with some      diet instructions and increase activity slowly to lose weight.  4. Dyslipidemia per Dr. Alphonsus Sias.  The goal should be an LDL less than      70 and HDL greater      than 40.  5. Followup.  We will see the patient back in 6 months or sooner if      needed.     [name], MD, [facility]  Electronically Signed    JH/MedQ  DD: 01/30/2008  DT: 01/31/2008  Job #: [id number]   cc:   [name], MD

## 2010-12-05 NOTE — Discharge Summary (Signed)
NAME:  [name] Ball, [name] Ball NO.:  [id number]   MEDICAL RECORD NO.:  [id number]          PATIENT TYPE:  INP   LOCATION:  2917                         FACILITY:  [facility]   PHYSICIAN:  [name], MD, FACCDATE OF BIRTH:  05-02-1941   DATE OF ADMISSION:  04/10/2008  DATE OF DISCHARGE:  04/13/2008                               DISCHARGE SUMMARY   DISCHARGING PHYSICIAN:  [name], MD, [facility]   PRIMARY CARDIOLOGIST:  [name], MD, [facility]   DISCHARGE DIAGNOSES:  1. Chest pain status post cardiac catheterization this admission.  The      patient with double-vessel coronary artery disease, elevated left      ventricular filling pressures.  No clear etiology of chest pain,      but the patient has multiple areas of stenosis with recommendations      for stress Myoview for further clarification.  2. Chronic anemia.  The patient to follow up outpatient with GI for      EGD.  3. Allergy to PLAVIX.   PAST MEDICAL HISTORY:  1. Labile hypertension.  2. Coronary artery disease status post bare metal stent to the LAD and      O2, bare metal stent to the RCA in 2008.  3. Mild aortic stenosis.  4. Diabetes.  5. BPH.  6. Obstructive sleep apnea.  7. History of knee surgery.  8. Remote history of tobacco use.  9. Premature CAD in maternal side of the family.  10.Participant in the tracer drug study.   PROCEDURES THIS ADMISSION:  Cardiac catheterization on April 12, 2008.   HOSPITAL COURSE:  [name] Ball is a 70 year old Caucasian gentleman with  known history of coronary artery disease ,who has chronic stable angina  x1 week prior to this admission.  However, on day of admission, he felt  that angina was worse than it had been.  He rated a seven over scale of  1-10 and it did not resolve after sublingual nitroglycerin.  The patient  presented to [facility] Emergency Room, was started on nitroglycerin  drip which relieved his pain.  The patient was admitted,  anticoagulated  with heparin.  Plavix intolerance noted.  It was felt the patient would  benefit from cardiac catheterization for further evaluation.  EKG showed  nonspecific inferior ST changes.  The patient was taken to the cath lab  on April 12, 2008, by Dr. Clifton James, results as stated above.  Dr.  Antoine Poche in to see the patient on day of discharge.  The patient's vital  signs stable.  Hemoglobin 10.6, potassium 4.2, creatinine 1.13.  The  patient being discharged home to follow up with Dr. Antoine Poche on April 27, 2008, at 2:45.  Dr. Antoine Poche will determine if the patient needs a  stress Myoview and in regards to the anemia, the patient to have an EGD  scheduled.  The patient is unsure at the present time, which  gastroenterologist he sees, but states it is in the [facility].  His wife will clarify this when she returns.   DISCHARGE MEDICATIONS:  The only change that  have been made is his  Lopressor, which has been increased to 100 mg b.i.d. and I have asked  the patient to hold the metformin until April 15, 2008.  He will  continue the tracer drug study.  There was some concern whether or not  his clonidine was 0.1 mg b.i.d. or 0.2 mg.  According to the patient, it  is 0.2 mg b.i.d.  We will continue this dose.  Isosorbide 120 mg daily,  nitroglycerin as needed p.r.n. sublingual, Lantus insulin as previously  prescribed at bedtime.  The patient takes clonazepam, nortriptyline,  Mirapex, and doxazosin.  He will continue these as previously  prescribed.  Aspirin 325 daily, Niaspan 1000 mg at bedtime,  lisinopril/hydrochlorothiazide 20/12.5 mg b.i.d., omeprazole 20 mg  b.i.d., glipizide 10 mg b.i.d., Zocor 20 mg nightly, amlodipine 5 mg  b.i.d., and Celexa 40 mg daily.   The patient is given the post cardiac catheterization discharge  instructions along with a prescription for his Lopressor for which the  dose is increased.   DURATION OF DISCHARGE:  Greater than 30  minutes.      Dorian Pod, ACNP      [name], MD, [facility]  Electronically Signed    MB/MEDQ  D:  04/13/2008  T:  04/14/2008  Job:  [id number]

## 2010-12-05 NOTE — Discharge Summary (Signed)
NAME:  [name] Ball, [name] Ball NO.:  [id number]   MEDICAL RECORD NO.:  [id number]          PATIENT TYPE:  INP   LOCATION:  6529                         FACILITY:  [facility]   PHYSICIAN:  Pricilla Riffle, MD, FACCDATE OF BIRTH:  05-28-1941   DATE OF ADMISSION:  06/25/2007  DATE OF DISCHARGE:  06/28/2007                               DISCHARGE SUMMARY   CARDIOLOGIST:  Dr. [name].   PRIMARY CARE Finian Helvey:  Dr. Benjamine Mola at the [facility].   DISCHARGE DIAGNOSIS:  Unstable angina.   SECONDARY DIAGNOSES:  1. Coronary artery disease status post bare metal stenting of the LAD      in 2002.  2. Poorly controlled hypertension.  3. Type 2 diabetes mellitus.  4. Chronic lower extremity edema.  5. Benign prostatic hypertrophy.  6. Obstructive sleep apnea.  7. Hyperlipidemia.  8. GERD.   ALLERGIES:  PLAVIX WHICH HAS CAUSED BACKACHE IN THE PAST.   PROCEDURES:  Left heart cardiac catheterization with successful PCI  stenting of the distal right coronary arteries with placement of a 3.5 x  12 mm Liberte bare metal stent.   HISTORY OF PRESENT ILLNESS:  A 70 year old male with prior history of  CAD status post bare metal stenting of the LAD in 2002.  He also has a  history of difficult to control hypertension and at times experience  substernal chest pressure associated with elevation of pressures into  the 200s last about a minute and resolving spontaneously.  Because of  recurrence of symptoms, the patient presented to the [facility] ED where  he was pain free with systolic pressures in the 150s.  He was admitted  for evaluation.   HOSPITAL COURSE:  The patient ruled out for MI and underwent left heart  cardiac catheterization on December 5.  Catheterization revealed a 70%  mid stenosis in the LAD with a 90% stenosis in the proximal diagonal off  of the stented area of the LAD.  There was also 70% stenosis in the  distal RCA and PDA.  The distal RCA was  successfully stented with a 3.5  x 12 mm Liberte bare metal stent.  Because the patient is allergic to  Plavix, he was initially on Ticlid therapy.  [name] Ball has not had a  recurrence in chest discomfort, and his blood pressures have been  elevated in the 150-179 systolic range.  As a result, his beta-blocker  was increased to 150 mg b.i.d.  He has been ambulating without  difficulty and will be discharged home today in satisfactory condition.  As he will be on Ticlid, he will require bimonthly CBC and platelet  checks which he will have performed at the [facility] and results faxed to our  office.  It was felt the patient has recurrent symptoms.  He should  undergo Myoview testing to determine the ischemic significance of the 7%  lesion in the mid-LAD.   DISCHARGE LABORATORIES:  Hemoglobin 11.2, hematocrit 32.6, WBC 6.8,  platelets 140, MCV 94.5.  Sodium 135, potassium 4.3, chloride 101, CO2  28, BUN 14, creatinine 1.22, glucose 100,  INR 1.0, total bilirubin 0.7,  alkaline phosphatase 69, AST 30, ALT 25, albumin 3.6, CK 136, MB to 2.2,  troponin I 0.02, calcium 9.6, magnesium 1.7, D-dimer was 0.57, plasma  metanephrines are pending.   DISPOSITION:  The patient is being discharged home today in good  condition.   FOLLOW-UP PLANS AND APPOINTMENTS:  We will arrange follow-up with Dr.  Antoine Poche in approximately 2 weeks.  We asked him to follow up with Dr.  Benjamine Mola in approximately one month for CBC and platelets and then  bimonthly from there forward.   DISCHARGE MEDICATIONS:  1. Aspirin 325 mg daily.  2. Ticlid 250 mg b.i.d.  3. Tracer study drug daily.  4. Lopressor 150 mg b.i.d.  5. Nortriptyline 50 mg q.h.s.  6. Glipizide 10 mg b.i.d.  7. Omeprazole 20 mg daily.  8. Simvastatin 20 mg q.h.s.  9. Niacin 1,000 mg q.h.s.  10.Lisinopril HCTZ 20/12.5 mg b.i.d.  11.Amlodipine 10 mg daily.  12.Imdur 60 mg daily.  13.Celexa 4 mg daily.  14.Clonazepam 1 mg q.h.s.  15.Nitroglycerin 0.4 mg  sublingual p.r.n. chest pain.   STUDIES:  None.   DURATION DISCHARGE ENCOUNTER:  Thirty eight minutes including physician  time.      Nicolasa Ducking, ANP      Pricilla Riffle, MD, [facility]  Electronically Signed    CB/MEDQ  D:  06/28/2007  T:  06/29/2007  Job:  [id number]   cc:   [facility] Dr. Benjamine Mola

## 2010-12-05 NOTE — Assessment & Plan Note (Signed)
[facility]                            CARDIOLOGY OFFICE NOTE   JAMARKIS, BRANAM                      MRN:          [id number]  DATE:06/16/2007                            DOB:          April 29, 1941    REASON FOR VISIT:  Evaluate patient with recent hospitalization for  shortness of breath and chest discomfort.   HISTORY OF PRESENT ILLNESS:  The patient was hospitalized on November 4  with chest discomfort. He ruled out for myocardial infarction. He  subsequently did have a cardiac catheterization by Dr. Riley Kill. This  demonstrated the left vein was free of critical disease, the LAD had 20%  stenosis. There is a previous stent that was widely patent. There was a  diagonal with 70-80% narrowing. This was unchanged from the previous.  The circumflex had 40-50% stenosis and a proximal marginal. The right  coronary artery demonstrated some nonobstructive mid plaque. There is a  PDA with an inferior branch 70% stenosis. The EF was normal. The patient  was managed medically. He was seen for his anemia. Dr. Arlyce Dice plans an  outpatient GI workup.   The patient has done well following his discharge. He has had some  elevated blood pressures with systolics off in the 170-180s. He denies  any chest discomfort, neck or arm discomfort. He has not had any  palpitations. He had light-headedness once recently after bending over.  When he stood up he was light headed and his blood pressure was low at  that point.   Of note during the patient's hospitalization, he did have problems with  his blood pressure falling. He had prazosin discontinued.   PAST MEDICAL HISTORY:  Coronary artery disease (stenting of the LAD in  2002, angioplasty in 2002 secondary to InStent restenosis in the LAD),  dyslipidemia with low HDL, right knee surgery, obesity, hypertension.   ALLERGIES:  PLAVIX caused a rash.   MEDICATIONS:  1. TRACER study drug.  2. Nortriptyline 50 mg  q.h.s.  3. Glipizide 10 mg b.i.d.  4. Tetracycline.  5. Omeprazole 20 mg b.i.d.  6. Simvastatin 20 mg daily.  7. Metoprolol 75 mg b.i.d.  8. Niacin 1000 mg daily.  9. Aspirin 325 mg daily.  10.Lisinopril HCT 20/12.5 b.i.d.  11.Amlodipine 10 mg daily.  12.Isosorbide mononitrate 60 mg daily.   REVIEW OF SYSTEMS:  As stated in the HPI and otherwise negative for  other systems.   PHYSICAL EXAMINATION:  GENERAL:  The patient is in no distress.  VITAL SIGNS:  Blood pressure 149/69, heart rate 61 and regular, weight  241 pounds, body mass index 33.  HEENT:  Eyelids unremarkable. Pupils equal round and reactive to light.  Fundi not visualized.  NECK:  No jugular venous distention at 45 degrees, carotid upstroke  brisk and symmetric, no bruits, no thyromegaly.  LYMPHATICS:  No cervical, axillary or inguinal adenopathy.  LUNGS:  Clear to auscultation bilaterally.  BACK:  No costovertebral angle tenderness.  CHEST:  Unremarkable.  HEART:  PMI not displaced or sustained, S1 and S2 within normal limits,  no S3, no S4, no clicks, no rubs,  2/6 apical systolic murmur radiating  at the aortic outflow tract, no diastolic murmurs.  ABDOMEN:  Obese, positive bowel sounds, normal in frequency and pitch,  no bruits, no rebound, no guarding, no midline pulsatile mass, no  hepatomegaly, no splenomegaly.  SKIN:  No rashes, no nodules.  EXTREMITIES:  2+ pulses throughout, no edema, no cyanosis, no clubbing.  NEUROLOGIC:  Oriented to person, place and time. Cranial nerves II-XII  grossly intact. Motor grossly intact.   ASSESSMENT/PLAN:  1. Coronary disease. The patient had no change in his coronary      disease. In particular, he had a patent left anterior descending      stent. At this point, he needs continued risk reduction but no      further cardiovascular testing. He is not bothered by the chest      discomfort.  2. Hypertension. This is elevated. He had problems with his blood      pressure  falling previously. He is no longer on the prazosin.      However, he needs better control of the high readings. I am going      to increase his Toprol to 100 mg b.i.d. and see how he does with      this.  3. Obesity. We discussed again the need to lose weight with diet and      exercise.  4. Systolic heart murmur. The patient did have a mild aortic valve      stenosis and will follow this clinically. No further imaging is      necessary.  5. Carotid stenosis. I think the patient can have this followed again      in the summer. It will be 2 years since the previous. He had 40-59%      bilateral stenosis in June 2007.  6. Followup. I will see him back again in about 6 weeks or sooner if      needed.     [name], MD, [facility]  Electronically Signed    JH/MedQ  DD: 06/16/2007  DT: 06/17/2007  Job #: [id number]   cc:   [name], MD

## 2010-12-05 NOTE — H&P (Signed)
NAME:  [name] Ball, [name] Ball NO.:  [id number]   MEDICAL RECORD NO.:  [id number]          PATIENT TYPE:  INP   LOCATION:  2909                         FACILITY:  [facility]   PHYSICIAN:  Christell Faith, MD   DATE OF BIRTH:  June 19, 1941   DATE OF ADMISSION:  04/10/2008  DATE OF DISCHARGE:                              HISTORY & PHYSICAL   PRIMARY CARDIOLOGIST:  [name], MD, [facility]   CHIEF COMPLAINT:  Chest pain.   HISTORY OF PRESENT ILLNESS:  This is a 70 year old white man with a  history of coronary artery disease who has chronic stable angina  approximately once a week.  His usual angina is brought on with exertion  and relieved with one nitroglycerin.  Today at about 3:00 p.m., he  developed angina while working in the yard.  It was more severe than  usual.  He rated at 7/10 and it did not resolve even after four  sublingual nitroglycerin.  Later at dinner, his wife noticed he was  taking more nitroglycerin for ongoing discomfort and insisted that he  come to the emergency department.  He is currently symptom free on a  nitroglycerin drip.  He is fairly limited in what he can do by dyspnea  and this is a chronic problem, but has gotten worse lately.   PAST MEDICAL HISTORY:  1. Hypertension, labile.  2. Coronary artery disease, status post bare metal stent to the LAD in      2002 and bare-metal stent to the RCA in December 2008.  He is also      known to have a 90% diagonal lesion and 70% mid LAD lesion.  3. Echocardiogram in January 2008 showed LV ejection fraction 65%-70%      with mild aortic stenosis.  4. Diabetes mellitus.  5. BPH.  6. Obstructive sleep apnea.  7. History of knee surgery.   SOCIAL HISTORY:  He lives in [place] with his wife.  He is a retired  [employer] architect, smoked 40 pack years, but quit 25 years ago.  Denies  drugs.   FAMILY HISTORY:  Mother died of an MI in her 74s and father died of an  MI in his 38s.   ALLERGIES:  PLAVIX causes  back pain and questionable rash and he does  list that as an allergy.   MEDICINES:  1. Doxazosin 4 mg p.o. nightly.  2. Omeprazole 20 mg p.o. b.i.d.  3. Metformin 850 mg p.o. t.i.d.  4. Celexa 40 mg p.o. daily.  5. Pramipexole 0.125 mg p.o. nightly.  6. Amlodipine 5 mg p.o. b.i.d.  7. Niaspan 1 g p.o. nightly.  8. Zocor 40 mg p.o. daily.  9. Glipizide 10 mg p.o. b.i.d.  10.Lisinopril/hydrochlorothiazide 20/12.5 mg p.o. b.i.d.  11.Lopressor 75 mg p.o. b.i.d.  12.Aspirin 325 mg p.o. daily.  13.Imdur 60 mg p.o. daily.  14.Clonidine 0.1 mg p.o. b.i.d.  15.Tracer study drug as prescribed.  16.Lantus 45 units subcu nightly.  17.Centrum vitamin.   REVIEW OF SYSTEMS:  Positive for chronic stable angina, dyspnea on  exertion, weakness, and back pain and musculoskeletal  pain.  Otherwise,  balance of 14 systems as reviewed and is negative.   PHYSICAL EXAMINATION:  VITAL SIGNS:  Temperature 97.90, pulse 81,  respiratory rate 16, blood pressure 185/75, and saturation 100% on room  air.  GENERAL:  This is a very pleasant white man in no acute distress.  Head  is normocephalic and atraumatic.  Pupils equal, round, and reactive to  light.  Extraocular movements are intact.  Mucous membranes are moist.  Dentition is good.  NECK:  Supple.  Neck veins are flat.  There is probably no carotid  bruits, although cardiac murmur is heard in the carotid listening area.  CARDIOVASCULAR:  Normal rate, regular rhythm.  Normal S1 and S2.  There  is a 3/6 systolic ejection murmur at the base of the heart and a 4/6  holosystolic murmur at the apex of the heart radiating to the axilla.  LUNGS:  Clear to auscultation bilaterally without wheezing or rales.  ABDOMEN:  Soft, nontender, nondistended with normal bowel sounds.  EXTREMITIES:  No edema.  2+ dorsalis pedis and radial pulses  bilaterally.  MUSCULOSKELETAL:  No acute joint effusions or deformities.  NEUROLOGIC:  Facial expressions are symmetric and  normal, 5/5 strength  in all four extremities.   DIAGNOSTIC TESTS:  Chest x-ray shows severe lordosis with no acute  process.  EKG shows sinus rhythm with a rate of 82 beats per minute and  nonspecific inferior ST changes.   LABS:  White blood cell 12.9, hemoglobin 10.5, platelets 177.  Sodium  137, potassium 4.3, BUN 32, creatinine 1.4, glucose 350, CK-MB 4.4,  troponin less than 0.05.   IMPRESSION:  This is a 70 year old white man with an extensive coronary  artery disease history, as well as diabetes, and significant  hypertension who presents with unstable angina and prominent cardiac  murmurs on examination.  Presents with unstable angina and on  examination prominent cardiac murmurs.   PLAN:  1. Admit to the step-down unit to Dr. Antoine Poche.  2. Rule out myocardial infarction by cycling serial EKGs and cardiac      enzymes.  3. Continue aspirin, initiate heparin drip, and continue nitroglycerin      drip to maintain pain-free status.  4. We will obtain an echocardiogram to reassess the patient's LV      ejection fraction and his cardiac murmurs.  5. We will check a BNP.  6. Continue his antihypertensive therapy and the nitroglycerin drip.      In addition, there is room to increase some of his blood pressure      medicines if indicated.  Consider evaluation for renal artery      stenosis if this has not been performed already.  7. Needs better blood sugar control.  Continue Lantus and cover with      oral medicines and sliding scale.  8. Given the significant angina at rest today, the patient probably      does warrant a repeat cardiac catheterization versus inpatient      stress test.  Further plan pending the results of his cardiac      enzymes and ongoing workup.      Christell Faith, MD  Electronically Signed     NDL/MEDQ  D:  04/11/2008  T:  04/11/2008  Job:  [id number]

## 2010-12-05 NOTE — Discharge Summary (Signed)
NAME:  TYRONNE, BLANN NO.:  [id number]   MEDICAL RECORD NO.:  [id number]          PATIENT TYPE:  INP   LOCATION:  2010                         FACILITY:  [facility]   PHYSICIAN:  Luis Abed, MD, FACCDATE OF BIRTH:  07/15/1941   DATE OF ADMISSION:  04/21/2008  DATE OF DISCHARGE:  04/24/2008                         DISCHARGE SUMMARY - REFERRING   ADMITTING PHYSICIAN:  Dr. Daleen Squibb.   DISCHARGING PHYSICIAN:  Dr. Myrtis Ser.   DISCHARGE DIAGNOSES:  1. Chest discomfort of uncertain etiology.  2. Negative adenosine Myoview associated with above hypertension,      hyperglycemia with a history of diabetes, known coronary artery      disease.  3. History as previously.   BRIEF HISTORY:  Mr. Miyoshi is a 70 year old African American male who  presents with reoccurring chest discomfort.  Most recent catheterization  in September 2009 recommended medical therapy and to do a stress test if  he had reoccurring symptoms given his anatomy.  He developed symptoms on  the day of admission thus he was admitted for further evaluation.   PAST MEDICAL HISTORY:  1. Anemia.  2. Hypertension.  3. Mild aortic stenosis.  4. Diabetes.  5. Benign prostatic hypertrophy.  6. Obstructive sleep apnea.  7. Remote tobacco use.  8. Knee surgery.  9. Known coronary artery disease with multiple interventions.   LABORATORY:  Weight on April 22, 2008 was a 115 kg.  On admission  hemoglobin was 10.9, hematocrit 32.3, normal indices, platelets 170,  WBCs 8.1.  At the time of discharge hemoglobin was 10.3 and hematocrit  30.9, normal indices, platelets 167, WBCs 7.5.  Admission PTT was 27, PT  13.6.  Sodium 138, potassium 4.6, BUN 16, creatinine 1.03, glucose 102.  Normal LFTs, alk phos was slightly low at 37.  CK-MBs, relative indexes  and troponins were within normal limits x4.  Chest x-ray on April 21, 2008 did not show any active disease.  His Myoview imaging on  April 22, 2008 showed no  findings for infarction or ischemia and an  ejection fraction of 65%.  EKG showed normal sinus rhythm, normal axis,  nonspecific ST-T wave changes.   HOSPITAL COURSE:  Mr. Klingler was admitted to [facility] by Christain Sacramento  nurse practitioner and Dr. Juanito Doom.  He was continued on his home  medications as well as IV heparin.  Overnight he did not have any  further chest discomfort.  He remained slightly hypertensive at 145/58.  He ruled out for myocardial infarction.  An adenosine Myoview was  performed on April 22, 2008 by Christain Sacramento nurse practitioner.  Imaging  is previously described.  Catheterization films were reviewed by Dr.  Riley Kill who again recommended continue medical treatment at this time.  He noted that the patient is not on Plavix given a history of rash.  By  April 24, 2008 he was ambulating without difficulty.  Dr. Myrtis Ser felt  that the patient could be discharged home to keep his appointment with  Dr. Antoine Poche this coming Tuesday.  Dr. Myrtis Ser also recommended checking a  blood sugar in the office  on Tuesday and to keep his appointment with  the [facility] for possible endoscopy next week.   DISPOSITION:  The patient is discharged home on April 24, 2008 and  asked to maintain a low salt, fat, cholesterol ADA diet.  Activity is  not restricted.  Wound care not applicable.  He was asked to continue  his home medications which include:  1. Lantus 45 mg at bedtime.  2. Clonazepam 0.5 at bedtime.  3. Nortriptyline at bedtime.  4. Mirapex 0.125 at bedtime.  5. Doxazosin 4 mg at bedtime.  6. Metformin 850 mg b.i.d.  7. Tracer study drug daily.  8. Celexa 40 mg daily.  9. Amlodipine 5 mg b.i.d.  10.Norvasc 20 mg at bedtime.  11.Glipizide 10 mg b.i.d.  12.Lopressor was increased to 150 mg b.i.d.  13.Omeprazole 20 mg b.i.d.  14.Lisinopril/hydrochlorothiazide 20/12.5 mg b.i.d.  15.Niaspan 1000 mg at bedtime.  16.Aspirin 325 mg daily.  17.Clonidine 0.2 mg b.i.d.  18.Imdur 120 mg  daily.  19.Nitroglycerin as needed.   FOLLOW UP:  He was asked to keep his follow-up appointment with Dr.  Antoine Poche on April 27, 2008.  [facility]  When he sees  [place] in the office he was asked that he have his blood sugar checked.  He  was asked to bring all medications to all appointments.   Discharge time:  35 minutes.      Joellyn Rued, PA-C      Luis Abed, MD, [facility]  Electronically Signed    EW/MEDQ  D:  04/24/2008  T:  04/25/2008  Job:  [id number]   cc:   [name], MD, [facility]  [name], MD

## 2010-12-05 NOTE — Assessment & Plan Note (Signed)
[facility]                         GASTROENTEROLOGY OFFICE NOTE   [name] Ball, [name] Ball                      MRN:          [id number]  DATE:06/13/2007                            DOB:          Jun 08, 1941    REASON FOR CONSULTATION:  1. Dysphagia.  2. Anemia.   [name] Ball is a pleasant 70 year old white male referred through the  courtesy of Dr. Alphonsus Sias for evaluation.  He was recently hospitalized for  chest pain.  A normochromic normocytic anemia was noted.  Hemoglobin was  as low as 9.4.  It was over 12 in June 2008.  [name] Ball had been taking  ibuprofen 800 mg 3 times a day chronically for back pain.  There is no  history of melena or hematochezia.  He claims that his stools did not  show evidence for blood.  Iron studies were not diagnostic for an iron  deficiency anemia.  An acute myocardial infarction was ruled out.  Since  discharge, he has had no further episodes of chest pain.  He does  complain of dysphagia to solids.  Occasionally he may experience  dysphagia with liquids as well.  He denies pyrosis.  He takes Prilosec  daily.  He was last colonoscoped in 2004 where a non-adenomatous polyp  was removed.   PAST MEDICAL HISTORY:  Pertinent for coronary artery disease.  He is  status post bare-metal stent placement.  He has hypertension, diabetes,  arthritis and anxiety and depression and sleep apnea.   FAMILY HISTORY:  Pertinent for heart disease in his mother.   MEDICATIONS:  1. Amlodipine.  2. Lisinopril/hydrochlorothiazide.  3. Aspirin.  4. Niacin.  5. Metoprolol.  6. Simvastatin.  7. Omeprazole.  8. Glipizide.  9. Tetracycline.  10.Citalopram.  11.Hydrochlorothiazide.  12.Clonazepam.   He is allergic to PLAVIX.   He neither smokes nor drinks.  He is married and retired.   REVIEW OF SYSTEMS:  Positive for joint pains, back pain, and some loss  of hearing.   PHYSICAL EXAMINATION:  Pulse 56, blood pressure 106/68, weight  240.  HEENT: EOMI.  PERRLA.  Sclerae are anicteric.  Conjunctivae are pink.  NECK:  Supple without thyromegaly, adenopathy or carotid bruits.  CHEST:  Clear to auscultation and percussion without adventitious  sounds.  CARDIAC:  Regular rhythm; normal S1 S2.  There are no murmurs, gallops  or rubs.  ABDOMEN:  Bowel sounds are normoactive.  Abdomen is soft, nontender and  nondistended.  There are no abdominal masses, tenderness, splenic  enlargement or hepatomegaly.  EXTREMITIES:  Full range of motion.  No cyanosis, clubbing or edema.  RECTAL:  Deferred.   IMPRESSION:  1. Dysphagia:  Most likely secondary to peptic esophageal stricture.  2. Anemia:  While he likely has an anemia chronic disease, recent      blood loss certainly may be related to his ibuprofen use.  He could      have ulcers or erosions anywhere along the gastrointestinal tract.   RECOMMENDATION:  1. Upper endoscopy with dilatation as indicated.  2. Hold ibuprofen.  3. Followup Hemoccults.     [name]  Rosalio Macadamia, MD,FACG  Electronically Signed    RDK/MedQ  DD: 06/13/2007  DT: 06/13/2007  Job #: [id number]   cc:   [name], MD  [name], MD, [facility]

## 2010-12-05 NOTE — Cardiovascular Report (Signed)
NAME:  [name] Ball, [name] Ball NO.:  [id number]   MEDICAL RECORD NO.:  [id number]          PATIENT TYPE:  INP   LOCATION:  2807                         FACILITY:  [facility]   PHYSICIAN:  Arturo Morton. Riley Kill, MD, FACCDATE OF BIRTH:  1941/06/18   DATE OF PROCEDURE:  05/28/2007  DATE OF DISCHARGE:                            CARDIAC CATHETERIZATION   INDICATIONS:  Mr. Cordial is a 70 year old gentleman who has previously  undergone stenting of the left anterior descending artery in 1997. The  stent was patent.  He came in with shortness of breath and some chest  discomfort.  Importantly because of an elevated creatinine.  He recently  had both his ACE inhibitor and diuretic stopped and his blood pressures  has been higher, he was admitted, seen by Dr. Daleen Squibb and subsequently  referred for a repeat catheterization.  Importantly, his hemoglobin is  down to 9.4 grams.  He has also been using high dose nonsteroidal anti-  inflammatories for the past year or two.  The current study was done to  assess coronary anatomy.   PROCEDURE:  1. Left heart catheterization.  2. Selective coronary arteriography.  3. Selective left ventriculography.   DESCRIPTION OF PROCEDURE:  The patient was brought to the  catheterization laboratory, prepped and draped in usual fashion through  an anterior puncture, the femoral artery was easily entered.  A 5-French  sheath was placed.  Importantly, the patient had a little bit of oozing  around the femoral sheath.  This was probably related to the patient's  hypertension intravenous hydralazine was given to control the blood  pressure.  Subsequently intravenous nitroglycerin drip was started.  Following this, views of both coronary arteries were obtained.  Central  aortic and left ventricular pressures were measured.  Pigtail  ventriculography was performed in the RAO projection.   He subsequently was taken to the holding area after sewing the femoral  sheath  in because ACT of 175.   HEMODYNAMIC DATA:  1. Central aortic pressure beginning the case 185/85 with mean of 122.  2. Left ventricular pressure 180/34.  3. There is not a gradient on pullback across the aortic valve.   ANGIOGRAPHIC DATA:  1. On plain fluoroscopy there is some calcification of the coronaries.  2. The left main is free of critical disease.  3. The left anterior descending artery demonstrates about 20%      narrowing.  There was a previously placed radial stent that is      widely patent.  There is a diagonal has about 70 to 80% narrowing      that exits from the midportion of the stent.  Importantly, this      area looks stable from the previous study.  There is diffuse      luminal irregularity.  The mid distal LAD but un changed from prior      studies.  4. The circumflex provides about a 40-50% area of narrowing of      proximal marginal, but is relatively widely patent.  5. The right coronary artery, compared to the previous study  demonstrates an abnormal area over approximately 10 mm in the      distal right coronary.  This has the appearance potentially of a      nonobstructive ruptured plaque.  The PDA bifurcates and there is a      sub-branch with about 70% narrowing.  The remainder of the vessels      without critical narrowing.  6. Ventriculography in the RAO projection reveals vigorous global      systolic function.  No segmental abnormalities of contraction are      identified.   CONCLUSION:  1. Preserved left ventricular function.  2. Continued patency of the previously placed radius stent from 2002.  3. Systemic hypertension with marked elevated left ventricular end-      diastolic pressure.  4. Question ruptured plaque in the distal right coronary artery that      is not obstructive.   DISPOSITION:  The patient has a hemoglobin that is 9.  Etiology of this  is unclear.  This will need to be aggressively evaluated blood pressure  control will  be important.  Discontinuation of nonsteroidal anti-  inflammatory drugs will be undertaken.  He may eventually need  intravascular ultrasound of the right coronary artery, but at the  present time, he is not a candidate because of a history of GI bleeding  2 weeks ago, and a significant drop his hemoglobin.      Arturo Morton. Riley Kill, MD, [facility]  Electronically Signed     TDS/MEDQ  D:  05/28/2007  T:  05/29/2007  Job:  [id number]   cc:   [name], MD  [name], MD, [facility]  CV Laboratory

## 2010-12-05 NOTE — Assessment & Plan Note (Signed)
[facility] OFFICE NOTE   BATU, CASSIN                      MRN:          [id number]  DATE:08/28/2007                            DOB:          [birth year]    PRIMARY:  Dr. Tillman Abide.   REASON PRESENTATION:  Evaluate patient with coronary disease.   HISTORY OF PRESENT ILLNESS:  The patient returns for follow-up.  He had  PCI as described below.  He was on Ticlid because of the Plavix rash.  He has had no new symptoms since I last saw him.  He has been active  doing work around his house.  He denies any chest discomfort, neck or  arm discomfort.  He had no palpitations, presyncope or syncope.  He has  had no PND or orthopnea.  He did have one episode of burning chest  discomfort, not like previous angina.  This was relieved with Rolaids.  He has had problems with his blood pressure and now presents a very nice  blood pressure diary demonstrating systolics are very often in the 170s.   PAST MEDICAL HISTORY:  Coronary artery disease (status post bare meta  sent to the LAD in 2002.  Catheterization in December demonstrated left  main fairly diseased, the LAD stent was patent.  There was mid and  distal vessel 70% stenosis.  There is a diagonal coming out of the stent  with an osteal 90% stenosis.  The right coronary artery had a 70%  stenosis.  There appeared to be a ruptured plaque.  He had a bare metal  stent placed to this.), difficult to control hypertension, diabetes,  chronic lower extremity edema, benign prostatic hypertrophy, sleep  apnea, mild renal insufficiency.   ALLERGIES:  PLAVIX cause backache and questionably a rash.   MEDICATIONS:  Ticlopidine 250 mg b.i.d. clonidine 0.1 mg daily,  metformin 1000 b.i.d., isosorbide 60 mg daily, glipizide 10 mg b.i.d.,  lisinopril 40/25 b.i.d., TRACER study drug, acetaminophen, aspirin 325  mg daily.   REVIEW OF SYSTEMS:  As stated in the HPI,  otherwise have other systems.   PHYSICAL EXAMINATION:  The patient is in no distress.  Blood pressure 120/58, heart rate 64 and regular, weight 248 pounds.  HEENT:  Eyes are, pupils equal, round, reactive, fundi are visualized,  oral mucosa remarkable.  NECK:  No jugular distention at 45 degrees, carotid upstroke brisk and  symmetric, no bruits, thyromegaly.  Lymphatics no cervical, axillary, inguinal adenopathy.  LUNGS:  Clear to auscultation bilaterally.  BACK:  No costovertebral angle tenderness.  CHEST:  Unremarkable.  HEART:  PMI not displaced or sustained, S1-S2 within normal.  No S3, S4.  No clicks, rubs, murmurs.  ABDOMEN:  Obese, positive bowel sounds normal in frequency and pitch no  bruits, rebound, guarding or midline pulsatile mass.  No hepatomegaly.  No splenomegaly.  SKIN:  No rashes, no nodules.  EXTREMITIES:  2+, pulse throughout, no edema, cyanosis or clubbing.  NEURO:  Oriented to place, time, cranial nerves II-XII grossly intact,  motor grossly intact.   ASSESSMENT/PLAN:  1. Hypertension.  Blood  pressure is still elevated.  I am going to      increase his clonidine to 0.2 mg b.i.d.  He understands therapeutic      lifestyle changes to include weight loss.  2. Coronary artery disease.  Take him off ticlopidine as it was a bare      metal stent and it has been well over a month since this was      placed.  He is on aspirin 325 mg daily.  3. Diabetes per Dr. Alphonsus Sias.  4. Dyslipidemia.  Remains on TRACER study drug all have this followed      within the context of that clinical trial.  5. Obesity.  Understand the need to lose weight with diet and      exercise.  6. Aortic stenosis.  Mild stenosis which will follow clinically.  7. Followup.  Will see him back in about 4 months.     [name], MD, [facility]  Electronically Signed    JH/MedQ  DD: 08/28/2007  DT: 08/29/2007  Job #: [id number]   cc:   [name], MD

## 2010-12-05 NOTE — Discharge Summary (Signed)
NAME:  [name] Ball, [name] Ball NO.:  [id number]   MEDICAL RECORD NO.:  [id number]          PATIENT TYPE:  INP   LOCATION:  2001                         FACILITY:  [facility]   PHYSICIAN:  [name], MD, FACCDATE OF BIRTH:  1941/07/19   DATE OF ADMISSION:  05/27/2007  DATE OF DISCHARGE:  06/02/2007                         DISCHARGE SUMMARY - REFERRING   ADDENDUM   Discharge on Mr. Hann was anticipated on May 30, 2007 if patient  was ambulating adequately and blood pressure was stable.  However on the  7th the patient had not ambulated adequately stating that he remained  weak and he became dizzy with standing.  Orthostatics were performed on  the 7th and showed a lying blood pressure 133/64, a sitting blood  pressure 119/63, and a standing blood pressure 107/61 with corresponding  pulses 52, 59, 59.  Thus he was not discharged home.  He complained of  feeling faint; thus he remained for further evaluation.  Nursing  continued to document lightheadedness with a manual blood pressure in  the right arm 80/50.  However, at 1 o'clock on the afternoon __________  is 136/72.  Subsequent orthostatic blood pressures on the 7th showed a  sitting blood pressure 163/74 with pulse 65, sitting 144/64 with pulse  63, and standing 179/79 with a pulse 57.  Dr. Gala Romney discontinued  prazosin and bolused him his fluid, and also started him on a stool  softener as he has not had a bowel movement in one week.  By the 10th  Dr. Daleen Squibb reviewed and noted that his blood pressure had not dropped  since the prazosin was discontinued and was felt that he could be  discharged home.   At the time of discharge on the 9th his last H&H was 10.9 and 32 with  normal indices, platelets 194, WBC 6.8.  Sodium 131, potassium 4.6, BUN  22, creatinine 1.51, glucose 171.  Stools were heme negative on the  10th.  It was felt that he could be discharged home with the previous  diagnoses as listed per the  Discharge Summary on the 7th.  In  addition  to that orthostatic hypotension would be added to the Discharge  Diagnoses.  His medications at the discharge time of the 7th and  depicted on that Discharge Summary also had not changed.  However, we  have discontinued the prazosin 10 mg nightly.  All other medications  remain the same as listed on May 30, 2007.  Dr. Daleen Squibb reviewed extensively these medications with the patient.  He  will keep his follow-up appointment with Dr. Antoine Poche and with  Dr. Arlyce Dice as scheduled.  Discharge time on the 10th 45 minutes.      Joellyn Rued, PA-C      Jesse Sans. Daleen Squibb, MD, [facility]  Electronically Signed    EW/MEDQ  D:  06/02/2007  T:  06/02/2007  Job:  [id number]   cc:   [name], MD  [name], MD, [facility]  Barbette Hair. Arlyce Dice, MD,FACG  [facility]

## 2010-12-05 NOTE — Assessment & Plan Note (Signed)
[facility] OFFICE NOTE   BUDD, FREIERMUTH                      MRN:          [id number]  DATE:01/29/2007                            DOB:          1941-02-10    PRIMARY:  [name], M.D.   REASON FOR PRESENTATION:  Evaluate the patient with coronary artery  disease.   HISTORY OF PRESENT ILLNESS:  The patient returns for followup.  Since I  last saw him, he has had no new symptoms.  He has continued to have some  weakness.  He says he just gives out doing any tasks.  He is not  describing chest discomfort or shortness of breath.  He is not  describing any palpitations, pre-syncope, or syncope.  He is not  describing any PND or orthopnea.  He did have some hypertension noted  particularly in the evening.  He says that, in the morning, his blood  pressure is low.  In fact, his systolic blood pressure was 90 in the  office, though he was not having any particular symptoms related to  this.   Of note, the patient has also had leg pain, but had a workup including a  lower extremity CT angiogram.  There were no high-grade lesions.  He was  seen by Dr. Excell Seltzer.   PAST MEDICAL HISTORY:  Coronary artery disease status post LAD stent in  2002.  Last May was free of significant disease.  The LAD had less than  10% narrowing.  There was a large diagonal, which was jailed by the  stent and had a 70% to 80% narrowing at the osteum, which was unchanged,  circumflex had 40% stenosis, the right coronary artery had 60% stenosis  in the PDA.  The EF was 60%.  Dyslipidemia with low LDL.  Right knee  surgery.  Obesity.   ALLERGIES:  PLAVIX causes rash.   MEDICATIONS:  1. Isosorbide dinitrate 10 mg t.i.d.  2. Amlodipine 10 mg daily.  3. Metformin 850 mg t.i.d.  4. Nortriptyline.  5. Prazosin 10 mg daily.  6. Lisinopril hydrochlorothiazide 20/12.5 two tablets daily.  7. Aspirin 325 mg b.i.d.  8. Niacin 1000 mg  daily.  9 . Ibuprofen 800 mg daily.  1. Metoprolol 50 mg b.i.d.  2. Simvastatin 20 mg daily.  3. Omeprazole 20 mg daily.  4. Pramipexole 0.125 mg daily.  5. Tetracycline.  6. Glipizide 5 mg b.i.d.   REVIEW OF SYSTEMS:  As stated in the HPI and otherwise negative for  other systems.   PHYSICAL EXAMINATION:  The patient is in no distress.  Blood pressure 90/60, heart rate 72 and regular.  HEENT:  Eyelids unremarkable.  Pupils equal, round, and reactive to  light.  Fundi not visualized.  Oral mucosa unremarkable.  NECK:  No jugular venous distension at 45 degrees.  Carotid upstroke  brisk and symmetric, left greater than right carotid bruit.  No  thyromegaly.  LYMPHATICS:  No cervical, axillary, or inguinal adenopathy.  LUNGS:  Clear to auscultation bilaterally.  BACK:  No costovertebral angle tenderness.  CHEST:  Unremarkable.  HEART:  PMI not displaced or sustained, S1 and S2 within normal limits,  no S3, no S4, no clicks, rubs, or murmurs.  ABDOMEN:  Obese, positive bowel sounds, normal in frequency and pitch,  no bruits, rebound, guarding.  No midline pulsatile mass, hepatomegaly,  splenomegaly.  SKIN:  No rashes, no nodules.  EXTREMITIES:  With 2+ pulses, moderate bilateral lower extremity edema.  NEURO:  Grossly intact.   ELECTROCARDIOGRAM:  Sinus rhythm, rate 63, axis within normal limits,  intervals within normal limits, no acute ST-T wave changes.   ASSESSMENT AND PLAN:  1. Hypertension.  The patient's blood pressure fluctuates.  He says      that in the morning it is sometimes low, in the evening sometimes      in the 180s to 190s.  I have asked him to keep a blood pressure      diary and to discuss this with Dr. Alphonsus Sias, who will be seeing him      soon.  He may need to have some adjustment to the timing of his      medications to avoid these hypertensive spikes.  At this point, I      will not react to this 1 low blood pressure, which I repeated and      verified.   He is not having any symptoms related to it.  I have      advised him that, if he does get a low blood pressure, he should      just lie down and keep his feet up for a while, and repeat the      reading.  2. Coronary artery disease.  He is having no new symptoms.  No further      cardiovascular testing is suggested.  He will continue with risk      reduction.  3. Fatigue.  I do not have a clear etiology to this.  He says his CPAP      is working.  He sleeps well at night.  It could be medications.      However, I would not suggest discontinuing any of the cardiac meds      for now, unless his blood pressure remains low.  I have asked him      to start walking more routinely to see if he can improve his      exercise tolerance.  4. Dyslipidemia, per Dr. Alphonsus Sias.  5. Followup.  I will see the patient back again in 6 months or sooner      if needed.     [name], MD, [facility]  Electronically Signed    JH/MedQ  DD: 01/29/2007  DT: 01/29/2007  Job #: [id number]

## 2010-12-05 NOTE — Discharge Summary (Signed)
NAME:  [name] Ball, [name] Ball NO.:  [id number]   MEDICAL RECORD NO.:  [id number]          PATIENT TYPE:  INP   LOCATION:  2001                         FACILITY:  [facility]   PHYSICIAN:  [name], MD, FACCDATE OF BIRTH:  Dec 01, 1940   DATE OF ADMISSION:  05/26/2007  DATE OF DISCHARGE:  05/30/2007                         DISCHARGE SUMMARY - REFERRING   DISCHARGE DIAGNOSES:  1. Dyspnea on exertion.  2. Acute coronary syndrome.  3. Mild pulmonary edema secondary to diastolic dysfunction.  4. Hypertension.  5. Obstructive sleep apnea.  6. Progressive coronary artery disease, continue medical treatment.  7. Heavy ibuprofen use, possible gastrointestinal source of admission      discomfort.  8. Hyperglycemia with elevated hemoglobin A1c of 7.3.  9. Normocytic anemia.  10.History as noted below.   PROCEDURES PERFORMED:  Cardiac catheterization on May 28, 2007 by  Dr. Riley Kill.   SUMMARY OF HISTORY:  [name] Ball is a 70 year old white male who for the  preceding months has described increased frequency and severity of his  angina, which he describes as pressure brought on with exertion,  relieved with nitroglycerin, associated with shortness of breath.  He  has been taking nitroglycerin much more often, where as previously he  was using this about once a week.  He states that the [place] discontinued  his lisinopril and hydrochlorothiazide because of concern of elevated  creatinine; however, his blood pressure has become significantly higher.  He has also correlated this with his increase of chest discomfort.   PAST MEDICAL HISTORY:  1. Diabetes.  2. Obesity.  3. Hypertension.  4. Lower extremity edema that has been worsening over the past 2-3      weeks.  5. Osteoarthritis.  6. Knee surgery.  7. BPH.  8. OSA.  9. Remote tobacco use.  10.Recently cessation of alcohol.  11.Coronary artery disease with bare-metal stenting to the LAD in      2002, residual stenosis of  60% to 70% in that stent at his      catheterization, May 2007.  Catheterization had been performed      secondary to a Cardiolite that suggested possible of apical      ischemia.  The catheterization at that time showed a 10% proximal      LAD lesion, 70% to 80% diagonal #1 jail by the LAD stent, 30%      proximal RCA, 60% mid PDA.  Echo in January 2008 showed an EF of      65% to 70%, mild AS, mild aortic root dilatation.   LABORATORY DATA:  Admission H&H were 10.3 and 30, normal indices,  platelets 145.000, WBC 7.5.  At the time of discharge on the 7th, H&H  were 10 and 28.7, normal indices, platelets 164,000, WBC 6.8.  Admission  PT was 14.1.  Sodium was 137, potassium 3.9, BUN 18, creatinine 1.08,  glucose 185, normal LFTs, magnesium 1.8.  On the 6th, sodium was 134,  potassium 3.4, BUN 14, creatinine 1.29, glucose 201, hemoglobin A1c was  7.3.  CK-MBs, relative indices and troponins were unremarkable x5.  Admission BNP was  367 and on the 5th, it was 335.  Fasting lipids showed  a total cholesterol of 95, triglycerides 141, HDL 24, LDL 43.  TSH  5.081.  Iron was low at 34, B12 536, folate elevated at 1307, ferritin  43.  Urinalysis was unremarkable.   Chest x-ray on admission revealed diffuse interstitial opacity;  interstitial pulmonary edema was suspected.   EKGs showed normal sinus rhythm, normal axis, nonspecific ST-T-wave  changes.   HOSPITAL COURSE:  [name] Ball was admitted to [facility] by the  resident and Dr. Lalla Brothers.  The patient remained in the emergency room  while waiting for a bed; while waiting, he had 2 subsequent episodes of  chest discomfort.  After review by Dr. Daleen Squibb, it was felt that he should  undergo cardiac catheterization for further evaluation of his increase  in symptoms.  [facility] Research discussed with the patient and his  wife in regards to the Tracer Study and he was accepted into their  protocol.  Unfortunately, heart catheterization  was cancelled on the 4th  for unclear reasons.  Respiratory assisted with CPAP management.  Pharmacy assisted with heparin.  By the 5th, his blood pressure of  finally improved and Dr. Daleen Squibb again agreed with cardiac catheterization.  Given his anemia, Dr. Daleen Squibb felt that he should have an outpatient GI  evaluation; the patient wished to have this in [facility] with a  followup endoscopy and colonoscopy as well.  Cardiac catheterization  performed on May 28, 2007 showed the possibility of a new plaque  rupture in the distal RCA, but at the time of cath, was less than 40%.  He had a branch off the PDA that was 70% and 20% proximal, LAD jailed  diagonal #1.  The stent at the LAD continued to have to 70% to 80%  residual, 40% to 50% mid circumflex.  Dr. Riley Kill felt that he should  continue medical management with significant increase in his blood  pressure medications, consider recatheterize or IVUS of the RCA once  stabilized.  His aortic and LV pressures at the time of catheter are  185/85 and 108/34.  Progression nurse assisted with discharge plans.  By  the 6th, Dr. Riley Kill noted that the patient had been taking a  significant amount of ibuprofen and wondered if his discomfort may have  been coming from a GI source versus the ruptured RCA plaque.  Progression nurse and [facility] assisted with discharge  needs.  By the 7th, the patient was doing well without any further chest  discomfort or shortness of breath.  He was still on IV nitroglycerin,  given his blood pressure.  Dr. Daleen Squibb increased some of his medications on  the morning of the 7th.  It was also noted on review of the [facility] that his  lisinopril and hydrochlorothiazide had been continued.  Dr. Daleen Squibb noted  that the patient had not had a bowel movement to test for blood, but  felt that if he was ambulating without difficulty and if his blood  pressure had improved by the afternoon of the 7th, he could be  discharged  home.   DISPOSITION:  The patient is discharged home.   DISCHARGE MEDICATIONS:  1. He received a new prescription for nitroglycerin as needed.  2. He was asked to increase his Imdur to 60 mg daily.  3. Prazosin 10 mg nightly.  4. Metoprolol 75 mg b.i.d.  5. He was asked to resume his hydrochlorothiazide 25 mg daily.  6.  Lisinopril 40 mg daily.  7. He received information in regards to Tracer study drug from      Research prior to discharge.  8. He was asked to continue Celexa 40 mg daily.  9. Aspirin 325 daily.  10.Zocor 40 mg nightly.  11.Omeprazole 20 b.i.d.  12.Niaspan 1000 mg nightly.  13.Norvasc 10 mg daily.  14.Glipizide 5 mg b.i.d.  15.Klonopin as previously.   DISCHARGE INSTRUCTIONS:  He was asked to continue a blood pressure diary  and bring all medications and blood pressure diary to all appointments.   WOUND CARE AND ACTIVITIES:  Per supplemental discharge sheet.   DIET:  He was asked to maintain a low-sodium ADA diet.   FOLLOWUP:  He will follow up with Dr. Antoine Poche on June 16, 2007 at  4:15 and with Dr. Arlyce Dice, GI, on June 13, 2007 at 11:30.  He will  continue followup as scheduled with the [facility].   DISCHARGE TIME:  Fifty minutes.      Joellyn Rued, PA-C      Jesse Sans. Daleen Squibb, MD, [facility]  Electronically Signed    EW/MEDQ  D:  05/30/2007  T:  05/30/2007  Job:  [id number]   cc:   [name], MD  [name], MD, [facility]  Barbette Hair. Arlyce Dice, MD,FACG  [facility]

## 2010-12-05 NOTE — Assessment & Plan Note (Signed)
[facility]                            CARDIOLOGY OFFICE NOTE   [name] Ball, [name] Ball                      MRN:          [id number]  DATE:04/27/2008                            DOB:          1940-09-03    PRIMARY CARE PHYSICIAN:  [name], MD   REASON FOR PRESENTATION:  Evaluate the patient with recent  hospitalizations for chest pain.   HISTORY OF PRESENT ILLNESS:  The patient was hospitalized initially in  late September.  She had a cardiac catheterization demonstrating the  left main was normal, the LAD had a 50-60% stenosis in the mid vessel  and 60-70% stenosis in the distal vessel.  The first diagonal had an  ostial 95% stenosis as it emerged from previous stented area.  The  circumflex at 40% stenosis in the mid circumflex.  The right coronary  artery had PDA with 90% stenosis, which has progressed slightly from  previous.  At that point, I reviewed the films and decided to manage the  patient medically as he had had no objective evidence of ischemia.  However, he subsequently came back with another episode of chest  discomfort.  This prompted a stress perfusion study, which demonstrated  an EF of 65% with no evidence of ischemia or infarct.  He was actually  discharged just 3 days ago.  Since that time, he has had no further  chest discomfort.  He has not been particularly active.  However, he is  denying any chest pressure, neck or arm discomfort.  He has had no  palpitation, presyncope, or syncope.  He has had no PND or orthopnea.   PAST MEDICAL HISTORY:  1. Coronary artery disease (status post bare-metal stent to the LAD in      2002.  Anatomy as described above).  2. Difficult to control hypertension.  3. Diabetes.  4. Chronic lower extremity swelling, mild.  5. Benign prostatic hypertrophy.  6. Erectile dysfunction.  7. Sleep apnea.  8. Mild renal insufficiency.   ALLERGIES/INTOLERANCE:  PLAVIX cause backache.   MEDICATIONS:  1. Metoprolol 150 mg b.i.d.  2. Ticlopidine daily.  3. Tracer study drug.  4. Nortriptyline 50 mg nightly.  5. Glipizide 10 mg b.i.d.  6. Tetracycline.  7. Omeprazole 20 mg b.i.d.  8. Simvastatin 20 mg daily.  9. Niacin 1000 mg nightly.  10.Aspirin 325 mg daily.  11.Lisinopril/HCT 40/25 daily.  12.Amlodipine 10 mg daily.  13.Isosorbide mononitrate 60 mg daily.   REVIEW OF SYSTEMS:  As stated in the HPI and otherwise negative for  other systems.   PHYSICAL EXAMINATION:  GENERAL:  The patient is pleasant and in no  distress.  VITAL SIGNS:  Blood pressure 114/58, heart rate 53 and regular, weight  253.4 pounds.  HEENT:  Eyes unremarkable, pupils equal, round, and reactive to light,  fundi not visualized, oral mucosa unremarkable.  NECK:  No jugular venous distention at 45 degrees, carotid upstroke  brisk and symmetric, no bruits, no thyromegaly.  LYMPHATICS:  No cervical, axillary, or inguinal adenopathy.  LUNGS:  Clear to auscultation bilaterally.  BACK:  No costovertebral angle  tenderness.  CHEST:  Unremarkable.  HEART:  PMI not displaced or sustained, S1-S2 within normal limits, no  S3, no S4, no clicks, rubs, no murmurs.  ABDOMEN:  Obese, positive bowel sounds normal in frequency and pitch, no  bruits, no rebound, no guarding, no midline pulsatile mass, no  hepatomegaly, no splenomegaly.  SKIN:  No rashes, no nodules.  EXTREMITIES:  2+ pulses, no edema, no cyanosis, no clubbing.  NEURO:  Oriented to person, place, and time, cranial nerves II through  XII grossly intact, motor grossly intact.   EKG sinus bradycardia, rate 55, axis within normal limits, intervals  within normal limits, no acute ST-wave changes.   ASSESSMENT/PLAN:  1. Coronary disease.  The patient does have coronary disease as      described.  He has had no objective evidence of ischemia, now his      Cardiolite is normal.  Therefore, I believe the results of his      stress test and that  his ongoing symptoms are not related to large      vessel obstructive coronary disease.  It may be small vessel      disease.  Towards that end, he did have his beta-blocker increased.      However, it could be noncardiac and he is going to see a      gastroenterologist soon apparently for an      esophagogastroduodenoscopy.  2. Hypertension.  Blood pressure is actually well controlled and he      will continue the medications as listed.  3. Diabetes per Dr. Tresa Res at [place], [place].  4. Dyslipidemia.  We will continue to follow this closely and      anticipate.  The goal will be an      LDL less than 70 and HDL greater than 50.  5. Followup.  I will see him again in about 2 months or sooner if      needed.     [name], MD, [facility]  Electronically Signed    JH/MedQ  DD: 04/27/2008  DT: 04/28/2008  Job #: [id number]   cc:   [name], MD

## 2010-12-05 NOTE — Assessment & Plan Note (Signed)
[facility]                            CARDIOLOGY OFFICE NOTE   DANG, MATHISON                      MRN:          [id number]  DATE:06/25/2008                            DOB:          1941-04-08    PRIMARY CARE PHYSICIAN:  [name], MD   REASON FOR PRESENTATION:  Evaluate patient with chest pain and coronary  disease.   HISTORY OF PRESENT ILLNESS:  This patient presents for followup of the  above.  Since I last saw him, he has had to take nitroglycerin one time  when he was walking.  But he is otherwise not having any chest  discomfort.  He has had some GI complaints.  He actually has seen a  gastroenterologist and is due to have a colonoscopy later this month.  He apparently has some anemia and is on iron.  He has had his sugars  checked and his hemoglobin A1c was 7.6.  His HDL was 28 and an LDL was  65 though his triglycerides were up to 190.  He has gained about 13  pounds.  He says he does not eat a lot, but I am not sure that he is  completely complaint with diet.  He is not having any new chest  pressure, neck or arm discomfort.  He is not have any new palpitation,  presyncope, or syncope.   PAST MEDICAL HISTORY:  Coronary artery disease (September  catheterization LAD 50-60% stenosis in the mid vessel, 60-70% stenosis  in the distal vessel, first diagonal ostial 95% stenosis, previous  restent in the area, the circumflex had 40% stenosis in the midvessel.  The PDA had 90% stenosis which was progressed slightly from previous.  We managed this medically.  The EF 65%.  He did have a stress perfusion  study demonstrating no evidence of infarct or ischemia), difficult to  control hypertension, having diabetes mellitus, chronic lower extremity  swelling, benign prostatic hypertrophy, erectile dysfunction, sleep  apnea, and mild renal insufficiency.   ALLERGIES/INTOLERANCE:  PLAVIX caused backache.   MEDICATIONS:  1. Vitamin C.  2. Iron.  3. Doxazosin.  4. Pramipexole 0.125 at bedtime.  5. Clonidine 0.3 mg b.i.d.  6. Lisinopril and hydrochlorothiazide 40/25 b.i.d.  7. Metformin 850 mg t.i.d.  8. Norvasc 2.5 mg b.i.d.  9. Metoprolol 150 b.i.d.  10.Niaspan 1000 mg at bedtime.  11.Omeprazole 20 mg daily.  12.Simvastatin 20 mg at bedtime.  13.Aspirin 325 mg daily.  14.Celexa 40 mg daily.  15.Imdur 120 mg daily.  16.Ticlopidine 75 mg daily.  17.TRACER study drug.  18.Nortriptyline 50 mg at bedtime.  19.Glipizide 10 mg b.i.d.   REVIEW OF SYSTEMS:  As stated in the HPI and otherwise negative for  other systems.   PHYSICAL EXAMINATION:  GENERAL:  The patient is in no distress.  VITAL SIGNS:  Her blood pressure 130/66, heart rate 60 and regular,  weight 266 pounds, and body mass index 35.  HEENT:  Eyes unremarkable.  Pupils are equal, round, and reactive to  light.  Fundi not visualized.  Oral mucosa is normal.  NECK:  No  jugular venous distension at 45 degrees, carotid upstroke  brisk and symmetric.  No bruits.  No thyromegaly.  LYMPHATICS:  No adenopathy.  LUNGS:  Clear to auscultation bilaterally.  BACK:  No costovertebral angle tenderness.  CHEST:  Unremarkable.  HEART:  PMI not displaced or sustained.  S1 and S2 are within normal  limits.  No S3.  No S4.  No clicks, rubs, or murmurs.  ABDOMEN:  Obese, positive bowel sounds, normal in frequency and pitch.  No bruits, rebound, or guarding.  No midline pulsatile mass.  No  organomegaly.  SKIN:  No rashes.  No nodules.  EXTREMITIES:  A 2+ pulses throughout, moderate bilateral lower extremity  edema with chronic venous stasis changes.  NEURO:  Grossly intact.   ASSESSMENT AND PLAN:  1. Coronary disease.  He is having no new symptoms.  No further      cardiovascular testing as suggested.  He will continue with risk      reduction.  2. Lower extremity edema.  We discussed conservative measures.  I gave      him compression stockings.  He needs to avoid  salt, keep his feet      elevated and loose weight.  3. Obesity.  I gave him instructions on losing weight and hopefully,      he can loose 20 pounds in the next 6 months.  4. Hypertension.  Blood pressure is currently controlled.  He will      continue with medication as listed.  5. Dyslipidemia.  His HDL is low.  However, weight loss with control      of his blood sugar will probably be the best to improve this.  6. Followup.  I will see him back in 6 months or sooner if needed.     [name], MD, [facility]  Electronically Signed   JH/MedQ  DD: 06/25/2008  DT: 06/26/2008  Job #: [id number]   cc:   [name], MD

## 2010-12-05 NOTE — Assessment & Plan Note (Signed)
[facility]                            CARDIOLOGY OFFICE NOTE   SABRI, TEAL                      MRN:          [id number]  DATE:07/14/2007                            DOB:          [birth year]    PRIMARY:  Dr. Benjamine Mola at [facility] and Dr. Tillman Abide.   REASON FOR PRESENTATION:  Evaluate Ball with recent PCI.   HISTORY OF PRESENT ILLNESS:  [name] Ball is a pleasant 70 year old  gentleman who was admitted on December 3 with chest discomfort.  Subsequently had a cardiac catheterization by Dr. Riley Kill.  This  demonstrated [name] left main was free of disease, [name] LAD had a stent.  Beyond this [name] mid and distal vessel with 70% stenosis.  There was a  diagonal coming out of his stented area that had 90% ostial stenosis.  [name] right coronary artery had 70% distal stenosis.  This appeared to be  ruptured plaque.  He subsequently had a Liberte stent placed.  He was  otherwise managed medically.   Since discharge [name] Ball has had none of [name] chest discomfort that  prompted his evaluation.  He gets a little dyspneic when he climbs [name]  stairs at night to go to bed and he has fleeting discomfort, but it is  not similar to his previous angina.  He is about to participate in  cardiac rehab.  He has had no new shortness of breath, PND or orthopnea.  He has had no palpitations, presyncope or syncope.   Of note, he has been taking his blood pressure and his systolics are  consistently above 200.   PAST MEDICAL HISTORY:  1. Coronary artery disease (status post bare metal stent to [name] LAD in      2002).  2. Difficult to control hypertension.  3. Diabetes.  4. Chronic lower extremity edema.  5. Benign prostatic hypertrophy.  6. Sleep apnea.  7. Mild renal insufficiency.   ALLERGIES:  PLAVIX CAUSED A BACKACHE.   MEDICATIONS:  1. Ticlopidine.  2. Tracer study drug.  3. Nortriptyline 50 mg nightly.  4. Glipizide 10 mg b.i.d.  5. Tetracycline 250  mg daily.  6. Omeprazole 20 mg b.i.d.  7. Simvastatin 20 mg daily.  8. Metoprolol 75 mg b.i.d.  9. Niacin 1000 mg nightly.  10.Aspirin 325 mg daily.  11.Lisinopril HCT 40/25 daily.  12.Amlodipine 10 mg daily.  13.Isosorbide mononitrate 60 mg daily.   REVIEW OF SYSTEMS:  As stated in [name] HPI and otherwise negative for  other systems.   PHYSICAL EXAMINATION:  [name] Ball is in no distress.  Blood pressure  181/80, heart rate 55 and regular, weight 251 pounds, body mass index  34.  HEENT:  Eyelids unremarkable, pupils equally round and reactive to  light, fundi not visualized, oral mucosa unremarkable.  NECK:  No jugular venous distention at 45 degrees, carotid upstroke  brisk and symmetric, transmitted systolic murmurs, no bruits.  ABDOMEN:  Obese, positive bowel sounds normal in frequency and pitch, no  bruits, no rebound, no guarding, no midline pulsatile mass, no  hepatomegaly, splenomegaly.  SKIN:  No rashes,  no nodules.  EXTREMITIES:  With 2+ upper pulses, 1+ dorsalis pedis and post tibialis  bilaterally, no cyanosis, no clubbing.  NEURO:  Oriented to person, place, and time; cranial nerves II-XII  grossly intact, motor grossly intact.   ASSESSMENT/PLAN:  1. Coronary disease.  [name] Ball does not have any ongoing angina.      His symptoms improved with [name] stent.  He is on ticlopidine because      he can not tolerate Plavix.  Will get CBCs every 2 weeks while he      is on this.  I will see him back in about 6 weeks and decide when      he can come off this drug.  2. Hypertension.  His blood pressure is very difficult to control.  I      am going to add clonidine 0.1 mg b.i.d.  Keep a watch on his blood      pressure and let me know.  3. Peripheral vascular disease, he had mildly ankle-brachial indices      and mild carotid plaquing 40% - 59% bilaterally.  We will make sure      that he has routine followup of this.  It is probably about time to      do his carotid.  4.  Dyslipidemia, he can come back fasting and we can check his lipids.      Needs and LD less than 70 and an HDL greater than 40.  5. Obesity, understands [name] need to lose weight with diet and      exercise.  6. Aortic stenosis, he has some mild aortic stenosis which will be      followed clinically.  7. Sleep apnea, will encourage continued treatment of this.  8. Followup, will see him back in 6 weeks.     [name], MD, [facility]  Electronically Signed    JH/MedQ  DD: 07/14/2007  DT: 07/14/2007  Job #: [id number]

## 2010-12-05 NOTE — Cardiovascular Report (Signed)
NAME:  [name] Ball, [name] Ball NO.:  [id number]   MEDICAL RECORD NO.:  [id number]          PATIENT TYPE:  INP   LOCATION:  2807                         FACILITY:  [facility]   PHYSICIAN:  Everardo Beals. Juanda Chance, MD, FACCDATE OF BIRTH:  Jul 27, 1940   DATE OF PROCEDURE:  06/27/2007  DATE OF DISCHARGE:                            CARDIAC CATHETERIZATION   CLINICAL HISTORY:  Mr. Scorsone is a 70 year old and had a radius stent  placed to the LAD in 2002.  He was admitted a month ago with chest pain  and underwent catheterization by Dr. Riley Kill.  He had what was thought  to be possibly a ruptured plaque in the distal right coronary and some  moderate disease in the left coronary system.  Medical treatment was  recommended.  He has had persistent exertional chest pain which has  become somewhat worse, and he was admitted last night by the fellow.  He  has a history of Plavix allergy with a rash dating back to early after  his first stent placement.   PROCEDURE:  The procedure was followed by femoral arterial sheath and 6-  [place] preformed coronary catheters.  After completion of diagnostic  study made decision to perform intravascular ultrasound on the right  coronary lesion.  The patient given antiemetics bolus infusion was given  500 mg of Ticlid.  We passed a Prowater wire down the right coronary  across the lesion without difficulty.  After giving intracoronary  nitroglycerin, passed an Atlantis IVUS catheter across the lesion and  did an automatic pullback.  This documented a ruptured plaque.  It  appeared that the true lumen was narrowed to about 2 mm and a vessel  size of 4 mm.  The reference lumens were 3.5 to 4 mm.  We then deployed  a 3.5 x 12 mm Liberte stent deploying this with one inflation of 18  atmospheres for 30 seconds.  We postdilated with a 4.0 x 8-mm Quantum  Maverick performing two inflations up to 15 atmospheres for 30 seconds.  Final diagnostic study was then performed  through the guiding catheter.  Right femoral artery was closed with Angio-Seal at the end procedure.   RESULTS:  The left main coronary artery:  The left main coronary artery  was short and free of significant of disease.   Left anterior descending artery:  The left anterior descending artery  gave rise a septal perforator and diagonal branch.  There was 0%  stenosis at the stent site in the proximal LAD.  There was 90% osteal  stenosis and diagonal branch which was jailed by the stent.  There was  diffuse irregularity in the mid and distal LAD with 70% mid and 50%  distal stenosis.   The circumflex artery:  The circumflex artery gave rise to a marginal  branch and posterolateral branch.  There were irregularities, but no  significant obstruction.   The right coronary artery:  The right coronary artery was a moderate-  sized vessel that gave rise to a posterior descending branch and  posterolateral branch.  There was 40% mid and 70% distal in  the left and  right coronary.  This stenosis appeared to be a ruptured plaque with  linear lucency.  There was 70-80% narrowing in the posterior descending  branch.   LEFT VENTRICULOGRAM:  The left ventriculogram performed in the RAO  projection showed good wall motion with no areas of hypokinesis.  The  estimated ejection fraction was 60%.   Intravascular ultrasound of the distal right coronary artery lesion  showed a distal reference of 4.0 mm and a proximal reference lumen of  3.5 mm.  There was a ruptured plaque at the site of the lesion which  narrowed the lumen to about 2.0 mm.   Following stenting of the lesion, distal right coronary stenosis  improved from 70% to 0%   CONCLUSION:  1. Coronary artery status post prior PCI with 0% stenosis at the stent      site in the proximal LAD, 90% ostial stenosis in the diagonal      branch of the LAD (jailed by the stent), 70% stenosis of the mid-      LAD, no significant structural  circumflex artery, 70% stenosis in      the distal right coronary and 70-80% stenosis in the posterior      descending branch and right coronary with normal LV function.  2. Successful PCI of the lesion in the distal right coronary artery (a      ruptured plaque by IVUS) with improvement in Fentanyl narrowing      from 70% to 0%.   DISPOSITION:  The patient returned to the unit for further observation.  Although, the patient had a ruptured plaque which appeared to be an  unstable lesion, and I think required treatment, I am not certain that  this was responsible for his symptoms.  If he does have continued  symptoms, then I would recommend a Myoview scan to see if he localizes  ischemia either in the distribution of the posterior descending branch,  diagonal branch or distal LAD.      Bruce Elvera Lennox Juanda Chance, MD, [facility]  Electronically Signed     BRB/MEDQ  D:  06/27/2007  T:  06/27/2007  Job:  [id number]   cc:   [name], MD, [facility]  Cardiopulmonary Laboratory

## 2010-12-06 ENCOUNTER — Observation Stay: Payer: Self-pay | Admitting: Internal Medicine

## 2010-12-06 DIAGNOSIS — R079 Chest pain, unspecified: Secondary | ICD-10-CM

## 2010-12-08 NOTE — Discharge Summary (Signed)
[place]  Patient:    [name] Ball, [name] Ball                      MRN: [id number] Adm. Date:  [id number] Disc. Date: 12/25/00 Attending:  [name] Dictator:   Tereso Newcomer, P.A. CC:         Tillman Abide, M.D.   Discharge Summary  DATE OF BIRTH:  1941-02-28  DISCHARGE DIAGNOSES: 1. Unstable angina, status post percutaneous transluminal coronary angioplasty    to first diagonal and left anterior descending this admission. 2. Coronary artery disease. 3. Preserved left ventricular systolic function with an ejection fraction of    65%. 4. Hyperlipidemia with a low HDL. 5. Hypertension. 6. Diabetes mellitus type 2. 7. History of right knee surgery. 8. Allergy to PLAVIX.  PROCEDURES: 1. Cardiac catheterization by Dr. [name] on December 24, 2000 revealing    left main short and normal, LAD with 25% proximal stenosis, tapering 99%    stenosis proximal to and involving the ostium of the first diagonal, first    diagonal with 90-95% stenosis of the ostium, remainder of the LAD with    focal 50% followed by a long 50-60% stenosis.  Circumflex with a focal 30%    stenosis after a large mid obtuse marginal.  RCA with diffuse luminal    irregularities, scattered 25% lesions, and a 25-30% PDA stenosis.  LV-gram    revealed an EF of 65%. 2. Percutaneous coronary intervention by Dr. Shawnie Pons on December 24, 2000.    LAD was dilated with a cutting balloon.  Diagonal underwent PTCA, reducing    stenosis from 80-50%.  HOSPITAL COURSE:  This 70 year old male presented on December 23, 2000 with an episode of rest pain relieved with nitroglycerin and another episode of nocturnal pain and more pain on the morning of admission.  His physical exam revealed a blood pressure of 130/80.  Cardiac normal sinus rhythm.  Chest clear.  Extremities without edema.  EKG revealed sinus bradycardia with diffuse J point elevation.  No acute changes.  It was decided  that the patient was suffering from unstable angina and he should be admitted to [facility]. He was placed on heparin.  He remained pain free overnight.  On the morning of December 24, 2000, he underwent cardiac catheterization by Dr. [name], as noted above.  Subsequently, following the coronary angiography, he underwent PCI by Dr. Shawnie Pons, as noted above.  He tolerated the procedure well and had no immediate complications.  On the morning of December 25, 2000, he was noted to be in stable condition.  His right groin was without hematoma or bruising.  Therefore, it was felt he was stable enough for discharge to home. The patients lipid profile had been abnormal in the past with a low HDL. Therefore, it was decided to decrease the patients Lipitor to 10 mg a day and add Niaspan 500 mg q.h.s.  Also, his Altace was increased to 10 mg b.i.d. and his Norvasc was discontinued prior to discharge.  All of his other medications were continued.  LABORATORY DATA:  White blood cell count 8800, hemoglobin 13.7, hematocrit 38.9, platelet count 184,000.  Sodium 140, potassium 4.1, chloride 104, CO2 31, glucose 106, BUN 15, creatinine 1.1.  Cardiac enzymes post-catheterization:  CK 182, CK-MB 1.4.  It was decided the patient would follow up in two weeks with Dr. Antoine Poche and he would have a BMP checked at that  time.  DISCHARGE MEDICATIONS:  1. Altace 10 mg b.i.d.  2. Lipitor 10 mg q.h.s.  3. Niaspan 500 mg q.h.s.  4. Tetracycline 250 mg q.o.d.  5. Coated aspirin 325 mg q.d.  6. Toprol XL 50 mg q.d.  7. Ibuprofen 800 mg t.i.d.  8. Glucotrol XL 5 mg q.d.  9. Nitroglycerin p.r.n. chest pain. 10. He was advised to stop taking his Norvasc.  ACTIVITY:  No driving, heavy lifting, exertion, or sex for three days.  DIET:  Low-fat/low-sodium diabetic diet.  DISCHARGE INSTRUCTIONS:  He should call our office for any groin swelling, bleeding, or bruising.  He is to see Dr. Antoine Poche on Monday, June  17, at 9:15 a.m. in [place].  He will have a BMP drawn at that time. DD:  12/25/00 TD:  12/25/00 Job: [id number]

## 2010-12-08 NOTE — Letter (Signed)
September 05, 2006    [name], MD, [facility]  [address]  [address]  [place],  [place] [address]   RE:  [name], Ball  MRN:  [id number]  /  DOB:  02-03-1941   Dear Leta Jungling:   Thanks for asking me to see [name] Ball for peripheral vascular  consultation on September 05, 2006, as an outpatient. As you know, Mr.  Ball is a 70 year old gentleman who has a history of coronary artery  disease status post multiple percutaneous interventions. He has  developed symptoms consistent with leg claudication and underwent an  exercise ABI test. He complains of pain in both hips, thighs and calves  with walking approximately 100 yards on level ground. His symptoms occur  with minimal walking when it involves an incline. His pain resolves 1-2  minutes after resting. Symptoms have been longstanding over the last  three years and have not changed much over that time, but they do limit  him from increasing his activity level. He denies any rest symptoms.   His pain patterns begins in his hips and then involves the thighs and  then the calves are the last muscle group where he experiences pain. He  denies any non-healing wounds or ulcers on the feet. He underwent  resting ABIs in June of 2007. They were normal. His common femoral  artery velocities were elevated, but he had triphasic flow throughout  both legs which are suggestive of mild obstructive disease. Because of  his typical symptoms, he was referred for an exercise ABI, which  demonstrated marked impairment of flow with exercise. His post-exercise  ABI was 0.34 on the right and 0.41 on the left, but he had rapid  recovery with normalization within 4 minutes after exercise.   Past medical history is pertinent for the following:  1. Coronary artery disease, status post left anterior descending      artery (LAD) stenting in 2002 and diagonal stenting as well. He      also underwent angioplasty for in-stent re-stenosis in the left  anterior descending artery (LAD).  2. Dyslipidemia with low HDL.  3. History of right knee surgery.  4. Obesity.   Medicines:  1. Amlodipine 10 mg daily.  2. Metformin 850 mg three times day.  3. Nortriptyline 25 mg two daily.  4. Prazosin 10 mg daily.  5. Lisinopril/hydrochlorothiazide 20/12.5 mg two daily.  6. Metoprolol 50 mg b.i.d.  7. Aspirin 325 mg.  8. Niacin 1 gram daily.  9. Ibuprofen 800 mg daily.  10.Zocor 20 mg daily.  11.Omeprazole 20 mg daily.  12.Isosorbide 10 mg three times daily.  13.Pramipexole 0.125 mg daily.  14.Tetracycline 250 mg daily.  15.Glipizide 5 mg b.i.d.   Allergies:  PLAVIX.   Social history:  The patient is retired. He loaded and drove a commercial truck. He is a  remote smoker. He quit smoking 25 to 30 years ago. He drinks  approximately two beers per day. He is married.   Family history is pertinent for:  Coronary artery disease. His mother died at age 79 of a myocardial  infarction. His father had a myocardial infarction at age 17.   Review of systems:  My complete 12-point review of systems was performed. Other than  mentioned above, the review of systems was completely  negative.   Physical examination:  The patient is alert and oriented. He is in no acute distress. Weight is  257 pounds, blood pressure is 168/80, heart rate 78, respiratory rate  is  16.  HEENT: Is normal.  NECK: Normal carotid upstrokes with bilateral carotid bruits.  Jugular  venous pressure is normal. There is no thyromegaly or thyroid nodules.  LUNGS:  Clear to auscultation bilaterally.  CARDIOVASCULAR: Apex is not palpable. The heart is regular rate and  rhythm with a 2/6 harsh early systolic murmur at the right upper sternal  border. A2 is preserved. There is no diastolic murmur or gallop.  ABDOMEN: Soft, obese and nontender. No organomegaly. No abdominal  bruits.  BACK: There is no paraspinal tenderness or flank tenderness.  SKIN: Is warm and dry without  rash.  LYMPHATICS: There is no adenopathy.  EXTREMITIES: There is no clubbing, cyanosis or edema. Femoral pulses are  2+. Both femoral pulses have significant bruits. There is almost a  palpable thrill over the right femoral artery. Popliteal and dorsalis  pedis pulses are 2+ and equal bilaterally. Posterior tibial pulses are  not palpable.  NEUROLOGIC: A&O x3. Cranial nerves II-XII are intact. Strength is 5/5  and equal in the arms and legs bilaterally.   Assessment:  [name] Ball is a 70 year old gentleman with typical symptoms of  claudication. His claudication symptoms are clearly lifestyle limiting.  His resting ABIs and arterial duplex studies are not suggestive of  severe peripheral vascular disease. However, his symptoms with low level  activity as well as his impairment with exercise ABIs are both  suggestive of proximal obstructive disease involving the aortoiliac  region.   I have reviewed potential diagnostic options which would include  diagnostic angiography or non-invasive study with either a CT angiogram  or MRA. I think it is reasonable to start with a CT angiogram to  evaluate his distal aorta and proximal iliofemoral vessels. This would  give [place] a road map for potential intervention as well as tell us the  anatomic severity of disease to determine if pursuing an angiogram is  worth while.   The patient is agreeable with this approach and this will be scheduled  and I will plan on seeing him back in the office after his CT is  completed.   Leta Jungling, thanks again for allowing me to see [name] Ball. Feel free to call  at any time with questions.    Sincerely,      Veverly Fells. Excell Seltzer, MD  Electronically Signed    MDC/MedQ  DD: 09/05/2006  DT: 09/05/2006  Job #: [id number]   CC:    [name], MD

## 2010-12-08 NOTE — Discharge Summary (Signed)
[facility]  Patient:    [name] Ball, [name] Ball Visit Number: [id number] MRN: [id number]          Service Type: CAT Location: 3700 [id number] Attending Physician:  Ronaldo Miyamoto Dictated by:   Brita Romp, P.A.C. Admit Date:  07/22/2001 Discharge Date: 07/23/2001   CC:         Dr. Verda Cumins, M.D. [facility]   Discharge Summary  DISCHARGE DIAGNOSES: 1. Chest pain, status post cardiac catheterization. 2. Papilledema. 3. Hypertension. 4. Diabetes mellitus.  HOSPITAL COURSE:  Mr. Kras is a pleasant 70 year old male with known coronary artery disease.  The patient was seen in the office on July 10, 2001, by [name], M.D.  Because of the patients chest pain and his past history, Dr. Antoine Poche felt it was prudent to schedule the patient for an outpatient cardiac catheterization.  On July 22, 2001, the patient was taken to the cath lab by Maisie Fus D. Riley Kill, M.D.  Catheterization results: 1. Left main coronary artery:  Normal. 2. Left anterior descending:  60% restenosis at stent; multiple 70% distal    lesions; 80% lesion in the stent in the first diagonal. 3. Left circumflex: No significant obstruction. 4. Right coronary artery:  Proximal 20 to 30% lesion. 5. Left ventriculogram:  Normal.  Dr. Riley Kill recommended continued medical therapy.  The next day, the patient was doing well and felt to be stable for discharge.  DISCHARGE MEDICATIONS: 1. Tetracycline 250 mg q.o.d. 2. Toprol XL 50 mg q.d. 3. Lipitor 10 mg q.d. 4. Niacin 1000 mg q.h.s. 5. Altace 10 mg b.i.d. 6. Norvasc 10 mg q.d. 7. Enteric coated aspirin 325 mg q.d. 8. Sublingual nitroglycerin 0.4 mg as needed.  DISCHARGE INSTRUCTIONS: 1. The patient was to avoid driving, heavy lifting or tub baths for 48    hours. 2. He is to follow a low fat, low cholesterol diet. 3. He is to watch the cath site for any pain, bleeding or swelling and to    call the  [place] office if any of these problems. 4. He is to follow up with a P.A. visit and a groin check in approximately    a week after discharge, the office is to call. Dictated by:   Brita Romp, P.A.C. Attending Physician:  Ronaldo Miyamoto DD:  07/23/01 TD:  07/23/01 Job: [id number]

## 2010-12-08 NOTE — Cardiovascular Report (Signed)
NAME:  [name] Ball, [name] Ball NO.:  [id number]   MEDICAL RECORD NO.:  [id number]          PATIENT TYPE:  OIB   LOCATION:  2899                         FACILITY:  [facility]   PHYSICIAN:  Charlies Constable, M.D. [facility] DATE OF BIRTH:  12/09/1940   DATE OF PROCEDURE:  12/20/2005  DATE OF DISCHARGE:  12/20/2005                              CARDIAC CATHETERIZATION   CLINICAL HISTORY:  Mr. Cornette is 70 years old and has had previous stenting  of the LAD with a radius stent in 2002 and subsequently had a procedure for  in-stent restenosis.  He recently has developed symptoms of exertional chest  pain.  He had a Cardiolite scan last fall which suggested possible apical  ischemia.  He was brought in today for evaluation with angiography.  When he  was seen in the office by Tereso Newcomer, P.A. and Charlton Haws, M.D., he was  also set up for carotid Dopplers to evaluate carotid bruits.   DESCRIPTION OF PROCEDURE:  The procedure was performed via the right femoral  artery using arterial sheath and 6-French preformed coronary catheters.  A  front wall arterial puncture was performed and Omnipaque contrast was used.  The right femoral artery was closed with Angioseal at the end of the  procedure.  The patient tolerated the procedure well and left the laboratory  in satisfactory condition.   RESULTS:  The left main coronary artery is a short vessel that is free of  significant disease.   The left anterior descending artery gave rise to small and large diagonal  branch and five septal perforators.  The stent in the proximal LAD had less  than 10% narrowing.  The large diagonal branch which was jailed by the stent  had a 70-80% narrowing at its ostium.  The narrowing in the stent and the  narrowing at the ostium of the diagonal branch appeared less than on the  previous study in 2002.   The circumflex artery gave rise to a small marginal branch, a large marginal  branch, and a posterolateral  branch.  There is 40% narrowing after the  marginal branch.   The right coronary artery is a moderate size vessel that gave rise to a  clonus branch, two right ventricular branches, a posterior branch, and a  posterolateral branch.  The right coronary artery was irregular in its  proximal and mid portion.  There is 30% proximal narrowing.  There was 60%  narrowing in the mid portion of the posterior descending branch.   The left ventriculogram performed in the RAO projection showed good wall  motion with no areas of hypokinesis.  The estimated ejection fraction is  60%.    The aortic pressure was 118/59 with mean of 82, a left ventricular is  118/30.   CONCLUSION:  Mostly nonobstructive coronary artery disease with less than  70% narrowing at the stent site in the proximal LAD, 50% narrowing in the  distal LAD, 70-80% ostial stenosis of the second diagonal branch which is  jailed by the stent, 40% in the mid right coronary and 30% narrowing in the  proximal right coronary artery with 60% narrowing in the posterior  descending branch and normal LV function.   RECOMMENDATIONS:  The patient appears to have nonobstructive coronary  disease.  The narrowing within the stent in the LAD and at the ostial  portion of the second diagonal branch appear less than on the previous  studies.  Will plan reassurance.  He was scheduled to have carotid Dopplers and it was  requested to have a distal aortogram, but we failed to do this so we will  plan to get outpatient Doppler studies of his lower extremities.  Will plan  discharge later today.           ______________________________  Charlies Constable, M.D. [facility]     BB/MEDQ  D:  12/20/2005  T:  12/20/2005  Job:  [id number]   cc:   [name], M.D.  [address]  [address]  [place]  [place] [address]   Arturo Morton. Riley Kill, M.D. [facility]  [address]  [address]  [place]  [place] [address]   Varney Baas, MD   Cardiopulmonary Lab

## 2010-12-08 NOTE — Assessment & Plan Note (Signed)
[facility]                            CARDIOLOGY OFFICE NOTE   JUMAR, GREENSTREET                      MRN:          [id number]  DATE:07/04/2006                            DOB:          11/28/1940    PRIMARY CARE PHYSICIAN:  Dr. Alphonsus Sias   REASON FOR PRESENTATION:  Patient with coronary disease and lower  extremity pain.   HISTORY OF PRESENT ILLNESS:  The patient is 70 years old.  He presents  for followup of the above.  Since I last saw him he has had some  fluctuating blood pressures.  He gets a reading every night and is in a  [place] study.  He reports some systolics in the 80s and does say that he  felt woozy with that.  Other times he has had blood pressures with  systolics in the 160s.  Again, he is having followup of this routinely  at the [place] and medication adjustments.  He is not having any further  chest discomfort since starting the isosorbide.  He was switched from  mono to dinitrates by the [place].  He is doing some activity but not  exercising as much as I would like.  With his level of activity he is  denying any chest pressure, neck discomfort, arm discomfort, but then  some shoulder pain when he raises his arms.  He is having no exercise  induced nausea or vomiting or excessive diaphoresis.  There has been no  palpitation, no presyncope.  He has had no paroxysmal nocturnal dyspnea  or orthopnea.  He thinks his breathing is in keeping with his level of  activity.  He has had some pain in his legs, down in his calves and feet  when he walks.   PAST MEDICAL HISTORY:  1. Coronary artery disease, status post LAD stent in 2002.  Last May      was free of significant disease.  The LAD had less than 10%      narrowing.  The large diagonal was jailed by the stent and had a 70-      80% narrowing of the osteum which was unchanged, the circumflex had      40% stenosis.  The right coronary artery had 60% stenosis in the      PDA.  The EF was 60%  (This was on catheterization May 2007).  2. Dyslipidemia with low LDL.  3. Right knee surgery.  4. Obesity.   ALLERGIES:  PLAVIX, CAUSED RASH.   MEDICATIONS:  1. Metoprolol 50 mg b.i.d.  2. Isosorbide dinitrate 10 mg b.i.d.  3. Amlodipine 10 mg q.d.  4. Metformin 150 mg t.i.d.  5. Nortriptyline 50 mg q.h.s.  6. __________  5 mg q.d.  7. Lisinopril & hydrochlorothiazide 20/12.5 two tablets q.d.   REVIEW OF SYSTEMS:  As stated in the history of present illness and  otherwise negative for other systems.   PHYSICAL EXAMINATION:  The patient is in no distress.  Blood pressure  162/88, heart rate 70 and regular, weight 248 pounds, body mass index  34.  HEENT:  Otherwise unremarkable, pupils equal,  round, reactive to light,  fundi within normal limits, oral mucosa unremarkable.  NECK:  No jugular venous distention, wave form within normal limits,  carotid upstroke brisk and symmetrical, no bruits, no thyromegaly.  LYMPHATICS:  No cervical, axillary, inguinal.  LUNGS:  Clear to auscultations bilaterally.  BACK:  No costovertebral angle tenderness.  CHEST:  Unremarkable.  HEART:  PMI not displaced or sustained, S1 and S2 within normal limits,  no S3, no S4, 2/6 apical systolic murmur radiating only very slightly  out the aortic output track, no diastolic murmurs.  ABDOMEN:  Obese, positive bowel sounds normal in frequency and pitch, no  bruits, no rebound, no guarding and a midline pulse.  No mass,  hepatomegaly, splenomegaly.  SKIN:  No rashes.  EXTREMITIES:  2+ upper pulses, 2+ dorsalis pedis bilaterally, absent  posterior tibialis bilaterally, no cyanosis, no clubbing, no edema.  NEURO:  Oriented to person, place, and time, cranial nerves II-XII  grossly intact, motor grossly intact.  ELECTROCARDIOGRAM:  Sinus rhythm, rate 70, axis within normal limits,  intervals within normal limits, no acute ST wave change.   ASSESSMENT/PLAN:  1. Coronary disease, patient has no new coronary  symptoms.  He has      nonobstructive disease.  We are following and managing him      medically with aggressive risk reduction.  2. Claudication, the patient continues to have symptoms that are very      consistent with claudication.  He did have ABIs demonstrating 1.0      on both legs.  However, Dr. Samule Ohm suggested exercise ABIs if he      had convincing symptoms.  I will go ahead and order these.  Of      note, he has some nonobstructive bilateral carotid stenosis.  He is      due to have a followup of this in June.  3. Hypertension, he is having this followed closely by the [place].  He      said he has had some medication adjustments and he is getting daily      readings.  I will defer management to this study.  I will also      defer to Dr. Alphonsus Sias.  4. Dyslipidemia, per Dr. Alphonsus Sias with a goal LDL less than 70, and an      HDL in the 40s.  5. Obesity, he has lost a little weight.  I have encouraged more of      the same.  6. Followup, we will see him back in 6 months or sooner if needed.     [name], MD, [facility]  Electronically Signed    JH/MedQ  DD: 07/04/2006  DT: 07/04/2006  Job #: [id number]   cc:   [name], MD

## 2010-12-08 NOTE — Assessment & Plan Note (Signed)
[facility]                            CARDIOLOGY OFFICE NOTE   CRANSTON, KOORS                      MRN:          [id number]  DATE:08/08/2006                            DOB:          1940/09/01    PRIMARY CARE PHYSICIAN:  Dr. Alphonsus Sias   REASON FOR PRESENTATION:  To evaluate patient with lower extremity pain,  chest discomfort.   HISTORY OF PRESENT ILLNESS:  The patient presents for followup after an  exercise ABI test.  This demonstrated normal resting ABIs but a mildly  abnormal response with exercise, suggesting single level arterial  occlusive disease.  Most concerning the patient had chest discomfort 10  out of 10 at 3 minutes and 40 seconds of exercise.  He said this is  similar to his previous discomfort.  He gets this typically when he  exerts himself and he had pushed himself a fair amount that day.  It  goes away with rest.  He will occasionally take some subinguinal  nitroglycerin.  He has had some mild fleeting discomfort with rest and  has not had to take nitroglycerin for this.  He thinks the symptoms are  slowly worse since his catheterization earlier this year.  He denies any  resting shortness of breath and has no PND or orthopnea.  He denies any  resting leg pain.  He has no PND or orthopnea.   PAST MEDICAL HISTORY:  1. Coronary artery disease (stenting of the LAD in 2002, stenting of      the diagonal, angioplasty in 2002 secondary to in-stent restenosis      in the LAD. Most recent catheterization in May 2007 demonstrated      that the left main was free of significant disease, the LAD had a      10%  in-stent restenosis, and there was a large diagonal which was      jailed by the stent and 70% to 80% narrowing at it's osteum.      Circumflex had a 40% stenosis after a marginal branch.  The right      coronary artery had a proximal 30% stenoses.  There was a PDA with      60% stenoses.  The EF was 60%).  2. Dyslipidemia  with a low HDL.  3. Right knee surgery.  4. Obesity.   ALLERGIES:  PLAVIX caused a rash.   MEDICATIONS:  1. Isosorbide dinitrate.  (The patient has been out of this).  2. Amlodipine 10 mg daily.  3. Metformin 850 mg t.i.d.  4. Nortriptyline 50 mg daily.  5. Prazosin 5 mg daily.  6. Lisinopril/hydrochlorothiazide 40/25 daily.  7. Metoprolol 75 mg b.i.d.   REVIEW OF SYSTEMS:  As stated in the HPI, otherwise negative for other  systems.   PHYSICAL EXAMINATION:  The patient is in no distress.  Blood pressure 139/76, heart beat 68 and regular.  Weight 251 pounds.  HEENT:  Eyes unremarkable.  Pupils equal, round and reactive to light.  Fundi not visualized.  Oral mucosa unremarkable.  NECK:  No jugular venous distention at 45 degrees, carotid upstroke  brisk and symmetric.  Bilateral bruits.  No thyromegaly.  LYMPHATICS:  No cervical, axillary or inguinal adenopathy.  LUNGS:  Clear to auscultation bilaterally.  BACK:  No costovertebral angle tenderness.  CHEST:  Unremarkable.  HEART:  PMI not displaced or sustained, S1 and S2 within normal limits.  No S3, no S4, 2/6 apical systolic murmur radiating slightly at the  aortic output tract, no diastolic murmurs.  ABDOMEN:  Flat, positive bowel sounds.  Normal in frequency and pitch,  no bruits, no rebound, no guarding, no midline pulsatile mass, no  hepatosplenomegaly, no splenomegaly.  SKIN:  No rashes, denies.  EXTREMITIES:  2+ pulses upper, 2+ dorsalis pedis and posterior tibialis  bilaterally. No cyanosis, no clubbing, no edema.  NEURO:  Oriented to person, place and time.  Cranial nerves II through  XII grossly intact, motor grossly intact.   ASSESSMENT AND PLAN:  1. Chest discomfort:  The patient's chest discomfort has gotten worse.      At this point I think it is prudent to screen him with an adenosine      perfusion study to see if he has any larger areas of ischemia or      infarct than he has had previously.  In the  meantime I am going to      restart the isosorbide and make this mononitrate at 60 mg a day.      He has a sublingual nitroglycerin as well.  2. Claudication.  I will refer him to Dr. Samule Ohm or Dr. Excell Seltzer for      further evaluation of this.  3. Hypertension.  Blood pressure is elevated at night according to      records he brings from home. I do not know how accurate the blood      pressure cuff is.  Although I do not expect much of an impact, we      will see if the Imdur makes a difference.  He does need weight loss      which may also help.  He may need some adjustment for increased      night time coverage, based on future readings.  4. Obesity.  He understands he needs to lose weight with diet and      exercise.  5. Systolic murmur.  The patient has a systolic murmur that is more      prominent than I recall.  We will get an echocardiogram to evaluate      this.  6. Carotid stenosis.  The patient has moderate carotid disease and is      due to have this followed again in July.  His wife knows this.  7. Follow up will be in about 6 months or sooner based on the results      of the above findings.     [name], MD, [facility]  Electronically Signed    JH/MedQ  DD: 08/08/2006  DT: 08/08/2006  Job #: [id number]   cc:   [name], MD

## 2010-12-08 NOTE — Cardiovascular Report (Signed)
[place]  Patient:    [name] Ball, MARTIS Visit Number: [id number] MRN: [id number]          Service Type: CAT Location: 3700 3710 [id number] Attending Physician:  Ronaldo Miyamoto Dictated by:   Arturo Morton Riley Kill, M.D. [facility] Proc. Date: 07/26/01 Admit Date:  07/22/2001 Discharge Date: 07/23/2001   CC:         [name], M.D. [facility]  CV Laboratory   Cardiac Catheterization  INDICATIONS:  Mr. Mclear is a pleasant 70 year old male well known to Dr. Antoine Poche.  He has had prior percutaneous stenting and then repeat dilatation of the stent to the LAD that involves an LAD diagonal bifurcation. He has had some mild exertional chest discomfort, but the current study is done to reassess coronary anatomy.  The patient was seen in the office by Dr. Antoine Poche and set up for diagnostic catheterization.  PROCEDURE: 1. Left heart catheterization. 2. Selective coronary arteriography. 3. Selective left ventriculography. 4. Subclavian angiography.  CARDIOLOGIST:  Arturo Morton. Riley Kill, M.D. [facility]  DESCRIPTION OF THE PROCEDURE:  The procedure was performed from the right femoral artery using 6-French catheter.  He tolerated the procedure well without complication.  He was taken the holding area in satisfactory clinical condition.  Importantly, Dr. Antoine Poche came to the laboratory.  He and I both reviewed the films carefully before making any final recommendations.  HEMODYNAMIC DATA: 1. The central aortic pressure was 156/83. 2. LV pressure 184/24. 3. Pullback aortic pressure 182/78. 4. Pullback left ventricular pressure 182/22. 5. No aortic to left ventricular gradient noted on pullback.  ANGIOGRAPHIC DATA: 1. Ventriculography was performed in the RAO projection.  Overall systolic    function was well preserved, and LV function was hyperdynamic.  Ejection    fraction was calculated at 64%.  No segmental abnormalities or    contraction were identified. 2. The  subclavian had a slight atherosclerotic plaque in its mid portion    with evidence of some calcification.  There was some irregularity also    of the proximal vertebral.  The internal mammary appeared to be widely    patent and a suitable vessel for grafting. 3. The left main coronary artery is predominantly a common ostium. 4. The LAD demonstrated mild atherosclerotic change from the proximal    vessel out to the mid vessel with some narrowing of less than 20%.  At    the stent site, there is evidence of stenosis of what appears to be    about 60%.  This vessel is not nearly as tight as on the previous study    when it had to be re-dilated and involves the mid portion of the stent    as well as the LAD-diagonal bifurcation.  The diagonal itself has about    80% ostial narrowing.  The distal LAD beyond this point has multiple    areas of some irregularity with approximately 60 to 70% narrowing in    two locations.  The distal vessel wraps the apex. 5. The circumflex provides an insignificant first marginal branch that is    free of significant disease.  The circumflex provides predominantly a    second marginal that has perhaps very mild 20% proximal plaquing but    no high-grade areas of disease. 6. The A-V circumflex provides a small posterolateral vessel, and there    may be some modest narrowing of about 50% that involves the ostium of    the small portion of the A-V  circumflex. 7. The right coronary artery is a dominant vessel.  There is some diffuse    atherosclerotic change throughout the vessel but no high-grade areas of    focal narrowing.  The PDA proper bifurcates in the proximal portion and    is without critical disease.  The posterolateral system has also very    mild plaquing but without significant focal stenosis.  CONCLUSIONS: 1. Well preserved left ventricular function. 2. 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  DISPOSITION:  We discussed various potential options today.  One option would be the possibility of minimally invasive bypass to the LAD, although the LAD distally itself does have some disease.  The patients symptoms are fairly mild at this point, and the stent site itself six months out demonstrates not critical restenosis.  Dr. Antoine Poche feels the best option at this point would be for continued medical management, and he has added amlodipine 10 mg q.d. to the patients regimen and will follow him up in the office.  ADDENDUM:  The patients films have been carefully reviewed with the patients family. Dictated by:   Arturo Morton Riley Kill, M.D. [facility] Attending Physician:  Ronaldo Miyamoto DD:  07/26/01 TD:  07/26/01 Job: [id number]

## 2010-12-08 NOTE — Cardiovascular Report (Signed)
[place]. [facility]  Patient:    [name] Ball, [name] Ball                      MRN: [id number] Proc. Date: 09/17/00 Adm. Date:  [id number] Attending:  Ronaldo Miyamoto CC:         [name], M.D. [facility]  Theola Sequin, M.D.  [name] Ball, M.D.  CV Laboratory   Cardiac Catheterization  INDICATIONS:  [name] Ball is a 70 year old who presents with chest discomfort; it has been recurrent.  Diagnostic catheterization by Dr. Antoine Poche revealed a 90% stenosis involving the bifurcation of the diagonal and LAD.  He also had some distal disease.  Options were discussed and it was felt that a percutaneous approach would be optimal.  Risks, benefits and alternatives were discussed with the patient in detail on the night prior to the procedure.  PROCEDURES: 1. Percutaneous Cutting Balloon angioplasty of the left anterior descending    artery with implantation of a [employer] Radius stent 3.5. 2. Percutaneous transluminal coronary angioplasty x 2 of the distal artery.  DESCRIPTION OF PROCEDURE:  The patient was brought to the catheterization lab and prepped and draped in the usual fashion.  Xylocaine 1% was used for local anesthesia.  Through an anterior puncture the left femoral artery was entered. Heparin and Integrilin were given according to protocol.  A JL4 guiding catheter was utilized to cannulate the left coronary artery.  We had to use a Glidewire in order to get through the iliac and also there was some slight resistance traversing the iliac with the guide. The guide cannulated the left coronary artery.  A 0.014 Hi-Torque Floppy wire was then placed down the vessel and 3 mm x 15 Cutting Balloon was then utilized to cut the LAD.  The LAD appeared improved and then a 3.5 Quantum Ranger balloon was dilated to about 3.25.  There still appeared to be modest residual stenosis so the vessel was then stented using a 20 mm length 3.5 Radius stent.  This was  post-dilated with a Pensions consultant balloon.  Because of the distal disease, two lesions distally were then dilated using a 2.5 mm Maverick with general improvement in the lumen, although the distal vessel was a typical diabetic vessel.  The apical vessel opened up somewhat and covered the apex nicely.  HEMODYNAMIC DATA:  The central aortic pressure was 165/77.  ANGIOGRAPHIC DATA:  The left main coronary artery was free of critical disease.  The proximal LAD was moderately calcified without critical narrowing.  There was a 90% eccentric stenosis involving the origin of the diagonal.  Distally, there was diffuse disease with two segmental lesions of about 80%.  The apical vessel wrapped the apex and was a moderately large caliber vessel.  At the site of 90% narrowing at the diagonal takeoff, this was reduced to 0% after stenting and Cutting Balloon angioplasty.  The 50% origin of the diagonal stenosis became about 70%, but there was still excellent flow into this area and we elected to not dilate this.  The distal 80% tandem LAD stenoses were reduced from 80% to 40%.  There was a reasonable final angiographic result.  CONCLUSIONS: 1. Successful percutaneous angioplasty and stenting of the left anterior    descending artery as described above. 2. Successful percutaneous angioplasty of distal lesions as described above.  DISPOSITION:  The patient will be treated with aspirin and Plavix.  Followup will be with Dr. Antoine Poche. DD:  09/17/00 TD:  09/18/00 Job: [id number]

## 2010-12-08 NOTE — Cardiovascular Report (Signed)
[place]. [facility]  Patient:    [name] Ball, [name] Ball                      MRN: [id number] Proc. Date: 12/24/00 Adm. Date:  [id number] Attending:  [name]                        Cardiac Catheterization  PROCEDURES PERFORMED:  Left heart catheterization/coronary arteriography.  INDICATIONS:  Evaluate patient with unstable angina and prior PTCA and stenting of an LAD lesion.  PROCEDURAL NOTE:  Left heart catheterization was performed via the right femoral artery.  The artery was cannulated using anterior wall puncture.  A 6-French arterial sheath was inserted via the modified Seldinger technique. Preformed Judkins and a pigtail catheter were utilized.  The patient tolerated the procedure well and left the laboratory in stable condition.  RESULTS:  HEMODYNAMICS:  LV 186/17, AO 186/74.  CORONARIES: The left main was short and normal.  The LAD had diffuse luminal irregularities.  There was a proximal 25% stenosis.  There was a mid stent covering the first diagonal.  There was tapering 99% stenosis proximal to and involving the ostium of the first diagonal.  The first diagonal had a 90% to 95% stenosis at the ostium.  The remainder of the LAD had a focal 50%, followed by a long 50% to 60% stenosis.  The circumflex was a large vessel.  There were diffuse luminal irregularities. There was a focal 30% stenosis after a large mid obtuse marginal.  The right coronary artery was a dominant vessel, with diffuse luminal irregularities and scattered 25% lesions.  There was a 25% to 30% PDA stenosis.  LEFT VENTRICULOGRAM:  The left ventriculogram was obtained in the RAO projection with an ejection fraction of 65%.  CONCLUSION:  Severe recurrent left anterior descending coronary artery stenosis in the stented area and involving a diagonal.  PLAN:  We will proceed with percutaneous revascularization again of the LAD lesion.  I will continue with aggressive  secondary risk factor modification. D:  12/24/00 TD:  12/24/00 Job: [id number]

## 2010-12-08 NOTE — Discharge Summary (Signed)
[place]. [facility]  Patient:    [name] Ball, [name] Ball                      MRN: [id number] Adm. Date:  [id number] Disc. Date: 09/18/00 Attending:  Ronaldo Miyamoto CC:         Regan Rakers, M.D., [place], [place]  [name], M.D. [facility]  Tillman Abide, M.D., [facility], [place], [place]   Referring Physician Discharge [place]  DATE OF BIRTH:  05-May-2041  SUMMARY OF HISTORY:  Mr. Labrada is a 70 year old white male who was referred to chest discomfort and a positive stress test.  He described exertional chest discomfort and approximately two years ago a Cardiolite was negative. However, he continued to have mild exertional chest discomfort, however, this had been worse over the last two weeks.  It always would occur with exercise and was relieved with rest.  It radiated down both arms, associated with shortness of breath, but he denied nausea, diaphoresis, PND, orthopnea, or syncope.  Last week he did have an episode at rest and he visited his primary M.D.  He was sent to the [facility] for evaluation.  Stress Myoview revealed mild septal ischemia.  He was given Imdur, beta blocker, and Norvasc. He has had minimal chest discomfort since the stress test.  He comes to [employer]. [facility] for a diagnostic catheterization because his insurance will not pay for the procedure at [facility]. His history is also notable for non-insulin-dependent diabetes, hypertension, DJD, rosacea, status post right RACL repair, and hyperlipidemia.  The last lipid panel that we have available to [place] was in March of 2000 and showed a cholesterol of 227, HDL 38, LDL 137, and triglycerides 257.  LABORATORY DATA:  Laboratory data here at [facility]. [facility] showed sodium 138, potassium 3.9, BUN 14, and creatinine 1.0.  The chest x-ray did not show any active disease.  EKGs show normal sinus rhythm, sinus bradycardia, and  nonspecific ST-T wave changes.  Prior to discharge, the sodium was 136, potassium 3.6, BUN 15, and creatinine 1.1.  Hemoglobin 13.3, hematocrit 37.6, normal indices, platelets 203, WBC 7.7.  The post procedure CK total was 118 with an MB of 2.8 and troponin was 0.24.  HOSPITAL COURSE:  Cardiac catheterization was performed on September 16, 2000, by [name], M.D.  According to his progress note, his LV pressure was 160/27 and aorta 161/70.  The LAD showed diffuse luminal irregularities with a proximal 90-95% involving his diagonal 1.  He had a mid 60% lesion and a distal 50% lesion.  The circumflex also had diffuse luminal irregularities with a mid 30% lesion post an OM branch.  The RCA had diffuse luminal irregularities and a 60% proximal PDA.  The ejection fraction was 65%.  Films were reviewed with Arturo Morton. Riley Kill, M.D., and angioplasty and stenting were performed to the proximal LAD lesion, reducing this from 90% to 0%.  The diagonal 1 lesion underwent angioplasty.  The mid LAD went from 80% to 40% and the distal LAD from 80% to 40%.  He was maintained on Integrilin.  Post sheath removal and bed rest, he was ambulating in the halls without difficulty.  Both catheterization sites in the right and the left groin were intact.  After reviewing his chart on September 18, 2000, Arturo Morton. Riley Kill, M.D., felt that he could be discharged home.  DISCHARGE DIAGNOSES: 1. Unstable angina with a positive stress  Myoview.  Cardiac catheterization    with two vessel coronary artery disease. 2. Coronary artery disease. 3. Status post angioplasty with a cutting balloon and stenting to the proximal    left anterior descending and angioplasty to the mid distal left anterior    descending and diagonal.  DISPOSITION:  He is discharged home.  DISCHARGE MEDICATIONS:  His new medications include Plavix 75 mg daily for four weeks, Altace 5 mg q.d., and Lipitor 20 mg q.h.s.  He was asked to  not continue taking his Norvasc.  He was asked to resume his Glucotrol XL 5 mg b.i.d., Toprol XL 50 mg q.d., coated aspirin 325 mg q.d., tetracycline 250 mg every other day, sublingual nitroglycerin as needed, and Imdur 60 mg q.d.  ACTIVITY:  He was advised no lifting, driving, sexual activity, or heavy exertion for two days.  DIET:  Maintain a low-salt, low-fat, low-cholesterol ADA diet.  SPECIAL INSTRUCTIONS:  If he has any problem with his catheterization site, he was asked to call immediately.  He was instructed to bring all medications to his appointment.  He will have a BMP checked in one week.  He will need fasting lipids and LFTs in approximately six weeks.  FOLLOW-UP:  He will see [name], M.D., on October 03, 2000, at 11:30 a.m. DD:  09/18/00 TD:  09/18/00 Job: [id number]

## 2010-12-08 NOTE — Assessment & Plan Note (Signed)
[facility]                            CARDIOLOGY OFFICE NOTE   BENJIE, RICKETSON                      MRN:          [id number]  DATE:09/25/2006                            DOB:          20-Oct-1940    Mr. Reid was seen in followup as an outpatient at the [facility] on September 25, 2006. He is a 70 year old  gentleman who I initially evaluated on February 14 for bilateral hip and  leg pain that was concerning for claudication. He had unremarkable  resting ABIs but his exercise ABIs were abnormal. This is a finding that  is typically suggestive of proximal disease involving the aortoiliac  region. I elected to proceed with a CT angiogram of this area to  evaluate for significant obstructive disease and also to use as a  potential road map in case he required intervention and  revascularization.   The CT scan of the abdominal aorta with runoff was performed on February  26 and demonstrated widely patent proximal vessels with no obstructive  disease seen in either the aorta or the iliac arteries. There was mild  diffuse disease involving the superficial femoral arteries and a mild  focal stenosis in the right popliteal artery, Overall there was no  significant obstructive lower extremity arterial disease.   From a symptomatic standpoint, Mr. Barret reports no changes. He  continues to have hip pain with walking and also has pain in his thighs  and calves. He has no resting symptoms and no other complaints.   MEDICATIONS:  His medications are unchanged and include:  Amlodipine,  Metformin, nortriptyline, prazosin, lisinopril HCTZ, aspirin, niacin,  ibuprofen, metoprolol, Simvastatin, omeprazole, isosorbide, pramipexole,  tetracycline and Glipizide.   At today's visit, his weight was 254 pounds, blood pressure 146/76,  heart rate 71. The remainder of the exam was deferred as we spent time  reviewing the findings of the CAT scan  and in discussion.   ASSESSMENT:  Mr. Kuhnert is a 70 year old gentleman with mild  atherosclerotic disease involving the lower extremities. There is no  indication for arteriography at this point as he had a good quality CT  angiogram performed that demonstrated no focal high-grade stenosis and  no areas that warrant intervention. I suspect that his hip pain is  related to osteoarthritis and that there may be some degree of arterial  insufficiency as his exercise ABIs were abnormal. However, his anatomic  findings are fairly mild and there are no areas of disease to focus on.   I will plan on seeing Mr. Lienau back on a p.r.n. basis. He will continue  with aggressive risk factor modification to halt the progression of any  further disease. If he develops some progressive symptoms, I would be  happy to see him at any time.     Veverly Fells. Excell Seltzer, MD  Electronically Signed    MDC/MedQ  DD: 09/25/2006  DT: 09/25/2006  Job #: [id number]   cc:   [name], MD, [facility]  [name], MD

## 2010-12-08 NOTE — Procedures (Signed)
[place]. [facility]  Patient:    [name], [name] Ball                      MRN: [id number] Proc. Date: 12/24/00 Adm. Date:  [id number] Attending:  [name] CC:         [name], M.D. [facility]  Elyse Hsu, M.D.  Cardiac Catheterization Lab   Procedure Report  INDICATIONS:  [name] is a 70 year old well known to [place].  He has had previous stenting of the left anterior descending artery including dilatation of the LAD.  The current study was done by Dr. Antoine Poche who demonstrated in-stent restenosis and involvement of the diagonal.  We talked about the various options.  Dr. Antoine Poche has been following the patient extremely closely and preferred repeat percutaneous intervention as the primary approach given his patent circumflex and RCA disease.  As a result, preparations were made for percutaneous intervention.  Heparin and Integrilin were given according to protocol after discussing this with the patient.  The ACT was found to be appropriate.  A double wire technique was utilized using a traverse wire into the diagonal and a regular standard Hi-Torque Floppy wire in the LAD.  The diagonal was dilated with a 2.5-mm CrossSail balloon. Multiple dilatations were performed.  The native LAD proper in the area of the stent was dilated using a 3.25-mm, 10-mm length cutting balloon, and throughout the 10 to 12 mm in the stented area where there was restenosis, this was redilated.  The diagonal was also redilated.  Importantly, the patients restenosis was relatively short, covering only about a 10-mm area and therefore, we elected to defer radiation therapy at the present time. Following the cutting balloon dilatation, the LAD was markedly improved, and we touched up the diagonal branch with a repeat balloon dilatation with a 2.5-mm CrossSail.  We elected at this point to complete the procedure.  He was put in the holding area in satisfactory clinical  condition after removal of all catheters and sewing the sheath into place.  ANGIOGRAPHIC DATA:  The LAD proper demonstrates some proximal irregularity measuring about 25-30% luminal reduction.  The stented site with a ______ stent was subtotally occluded over about a 10-mm area.  There was a very short discrete stenosis of the diagonal.  Following balloon dilatation, the diagonal was reduced from 80% to 50%.  The 95% stenosis in the stented area was reduced to about 30% residual luminal narrowing.  There continued to be tandem stenoses of about 50-60% in the distal vessel as noted previously.  These were unchanged following the procedure.  CONCLUSIONS:  Successful percutaneous repeat balloon dilatation of both the LAD and diagonal.  DISPOSITION:  The patient does not have significant disease of the circumflex or RCA.  He does have a somewhat diffusely diseased distal LAD as well.  Dr. Antoine Poche has been aggressively managing his risk factors and will continue to follow him closely.  Should he have progressive symptoms, then repeat intervention with radiation or possibly revascularization surgery to the LAD would be recommended. DD:  12/24/00 TD:  12/24/00 Job: [id number]

## 2010-12-08 NOTE — Cardiovascular Report (Signed)
[place]. [facility]  Patient:    [name] Ball, [name] Ball                      MRN: [id number] Proc. Date: 09/16/00 Adm. Date:  [id number] Attending:  Ronaldo Miyamoto CC:         Theola Sequin, M.D., [facility]   Cardiac Catheterization  DATE OF BIRTH:  1941/02/08  PRIMARY PHYSICIAN:  Theola Sequin, M.D., [facility].  PROCEDURE:  Left heart cardiac catheterization/coronary arteriography.  INDICATIONS:  Evaluate patient with exertional new onset chest pain and a Cardiolite demonstrating anteroseptal ischemia.  DESCRIPTION OF PROCEDURE:  Left heart catheterization was performed via the right femoral artery.  The artery was cannulated using an anterior wall puncture. A #6 French arterial sheath was inserted via the modified Seldinger technique. Preformed Judkins and a pigtail catheter were utilized.  The patient tolerated the procedure well and left the lab in stable condition.  RESULTS:  HEMODYNAMICS:  LV 160/27, AO 160/76.  CORONARY ARTERIOGRAPHY:  Left main:  The left main coronary artery was normal.  Left anterior descending:  The LAD had diffuse luminal irregularities.  There was proximal 90-95% stenosis involving a large first diagonal.  There was mid 60% stenosis followed by 50% stenosis.  Circumflex:  The circumflex had diffuse luminal irregularities.  There was mid 30% stenosis after a very large mid obtuse marginal.  Right coronary artery:  The right coronary had diffuse luminal irregularities. It was a dominant vessel.  The PDA had proximal 60% stenosis.  LEFT VENTRICULOGRAM:  The left ventriculogram was obtained in the RAO projection.  The EF was 65%.  There was normal wall motion.  CONCLUSION:  Severe left anterior descending stenosis.  PLAN:  I would favor percutaneous revascularization of this LAD.  However, this would be a somewhat complicated procedure because of the large diagonal. I will review the films with Dr.  Riley Kill before planning definitive revascularization. DD:  09/16/00 TD:  09/17/00 Job: [id number]

## 2010-12-15 ENCOUNTER — Encounter: Payer: Self-pay | Admitting: Cardiology

## 2010-12-25 ENCOUNTER — Encounter: Payer: Self-pay | Admitting: Cardiology

## 2010-12-25 ENCOUNTER — Ambulatory Visit (INDEPENDENT_AMBULATORY_CARE_PROVIDER_SITE_OTHER): Payer: Medicare Other | Admitting: Cardiology

## 2010-12-25 DIAGNOSIS — E785 Hyperlipidemia, unspecified: Secondary | ICD-10-CM

## 2010-12-25 DIAGNOSIS — E669 Obesity, unspecified: Secondary | ICD-10-CM

## 2010-12-25 DIAGNOSIS — I6529 Occlusion and stenosis of unspecified carotid artery: Secondary | ICD-10-CM

## 2010-12-25 DIAGNOSIS — I359 Nonrheumatic aortic valve disorder, unspecified: Secondary | ICD-10-CM

## 2010-12-25 DIAGNOSIS — N189 Chronic kidney disease, unspecified: Secondary | ICD-10-CM

## 2010-12-25 DIAGNOSIS — I2510 Atherosclerotic heart disease of native coronary artery without angina pectoris: Secondary | ICD-10-CM

## 2010-12-25 DIAGNOSIS — I10 Essential (primary) hypertension: Secondary | ICD-10-CM

## 2010-12-25 DIAGNOSIS — M79606 Pain in leg, unspecified: Secondary | ICD-10-CM

## 2010-12-25 NOTE — Patient Instructions (Signed)
You are being scheduled for a carotid doppler and an ABI in the [place] office Please continue your current medications Please see Dr Antoine Poche back in 6 months

## 2010-12-25 NOTE — Assessment & Plan Note (Signed)
The patient understands the need to lose weight with diet and exercise. We have discussed specific strategies for this.  

## 2010-12-25 NOTE — Assessment & Plan Note (Signed)
He is due to have carotid Dopplers in July for followup. Because of leg pain and decreased peripheral pulses I will get ABIs as well.

## 2010-12-25 NOTE — Progress Notes (Signed)
HPI The patient presents for followup of multiple cardiovascular problems. He was recently hospitalized briefly at [facility] for chest pain.  He ruled out for myocardial infarction and had a stress perfusion study which was negative for ischemia. I am requesting the records. Since that time he has had no further chest discomfort. He has had no neck or arm discomfort. He has had no palpitations, presyncope or syncope. He has had no weight gain or edema. Unfortunately he hasn't had any weight loss. He says he is limited in his activities by knee pain and leg pain. He's not sure whether this is arthritis. It's difficult to assess whether he might be having claudication symptoms.  Of note he reports some renal insufficiency and is being referred to a nephrologist or urologist. He describes voiding difficulties. He reports a 24-hour urine done at the [facility]. I have none of the records.  Allergies  Allergen Reactions  . Clopidogrel Bisulfate     Current Outpatient Prescriptions  Medication Sig Dispense Refill  . amLODipine (NORVASC) 10 MG tablet Take 10 mg by mouth daily.       Marland Kitchen aspirin EC 325 MG tablet Take 325 mg by mouth daily.        Marland Kitchen CITALOPRAM HYDROBROMIDE PO Take 40 mg by mouth. daily       . CLONAZEPAM ODT PO Take 0.5 mg by mouth Nightly. Take 2       . cloNIDine (CATAPRES) 0.1 MG tablet Take 0.1 mg by mouth 2 (two) times daily. Take 3       . doxycycline (VIBRAMYCIN) 50 MG capsule Take 50 mg by mouth daily.        . ferrous sulfate 325 (65 FE) MG tablet Take 325 mg by mouth daily with breakfast.        . furosemide (LASIX) 20 MG tablet Take 20 mg by mouth daily.        Marland Kitchen glucose blood test strip 1 each by Other route 2 (two) times daily. Use as instructed       . hydrALAZINE (APRESOLINE) 25 MG tablet Take 25 mg by mouth 2 (two) times daily.       . insulin glargine (LANTUS SOLOSTAR) 100 UNIT/ML injection Inject into the skin as directed.        . insulin regular (NOVOLIN R) 100  UNIT/ML injection Inject into the skin as directed.        . isosorbide dinitrate (ISORDIL) 20 MG tablet Take 20 mg by mouth. Take 2 po tid       . lisinopril-hydrochlorothiazide (PRINZIDE,ZESTORETIC) 20-12.5 MG per tablet Take 1 tablet by mouth 2 (two) times daily.        . metoprolol (LOPRESSOR) 100 MG tablet Take 50 mg by mouth 2 (two) times daily. Take 3 tabs       . Multiple Vitamins-Minerals (CENTRUM SILVER PO) Take by mouth daily.        . niacin (NIASPAN) 1000 MG CR tablet Take 1,000 mg by mouth daily.        . nortriptyline (PAMELOR) 25 MG capsule Take 25 mg by mouth 2 (two) times daily.        Marland Kitchen omeprazole (PRILOSEC) 20 MG capsule Take 20 mg by mouth 2 (two) times daily.        . pramipexole (MIRAPEX) 0.125 MG tablet Take 0.125 mg by mouth daily.       . prazosin (MINIPRESS) 5 MG capsule Take 5 mg by mouth daily.        Marland Kitchen  simvastatin (ZOCOR) 10 MG tablet Take 10 mg by mouth at bedtime.        Marland Kitchen tetracycline (ACHROMYCIN,SUMYCIN) 250 MG capsule Take 250 mg by mouth. 3-4 times daily        . DISCONTD: atorvastatin (LIPITOR) 20 MG tablet Take 20 mg by mouth daily.        Marland Kitchen DISCONTD: acetaminophen (TYLENOL) 325 MG tablet Take 325 mg by mouth every 6 (six) hours as needed. (take 3 each time)           Past Medical History  Diagnosis Date  . Hx of colonic polyps   . Coronary artery disease     left main was normal, LAD had 50-60% stenosis in the mid vessel and 60-70% stenosis in the distal vessel. The first diagonal had ostial 95% stenosis as it emerged from the stented area. Circumflex at 40% stenosis in the mid circumflex. The right coronary artery had PDA with 90% stenosis, which has progressed slightly form previous.  . Diverticulosis of colon   . GERD (gastroesophageal reflux disease)   . Hyperlipidemia   . Hypertension   . Osteoarthritis     hands/knees  . Hypertrophy (benign) of prostate     benign prostatic   . Rosacea   . Restless legs syndrome   . Sleep apnea   . PTSD  (post-traumatic stress disorder)   . NIDDM (non-insulin dependent diabetes mellitus)   . Carotid artery stenosis     Past Surgical History  Procedure Date  . Knee surgery 1981    on right knee    ROS:  Knee pain, joint pain.  Otherwise as stated in the HPI and negative for all other systems.  PHYSICAL EXAM BP 104/48  Pulse 50  Resp 16  Ht 6' (1.829 m)  Wt 266 lb (120.657 kg)  BMI 36.08 kg/m2 GENERAL:  Well appearing HEENT:  Pupils equal round and reactive, fundi not visualized, oral mucosa unremarkable, edentulous NECK:  No jugular venous distention, waveform within normal limits, carotid upstroke brisk and symmetric, bilateral carotid bruits, no thyromegaly LYMPHATICS:  No cervical, inguinal adenopathy LUNGS:  Clear to auscultation bilaterally BACK:  No CVA tenderness CHEST:  Unremarkable HEART:  PMI not displaced or sustained,S1 and S2 within normal limits, no S3, no S4, no clicks, no rubs, early peaking systolic murmur ABD:  Flat, positive bowel sounds normal in frequency in pitch, no bruits, no rebound, no guarding, no midline pulsatile mass, no hepatomegaly, no splenomegaly, obese EXT:  2 plus pulses upper, absent DP/PT bilateral, no edema, no cyanosis no clubbing SKIN:  No rashes no nodules NEURO:  Cranial nerves II through XII grossly intact, motor grossly intact throughout PSYCH:  Cognitively intact, oriented to person place and time   EKG:  Sinus rhythm, rate 50, axis within normal limits, intervals within normal limits, no acute ST-T wave changes.   ASSESSMENT AND PLAN

## 2010-12-25 NOTE — Assessment & Plan Note (Signed)
This is being followed at the [place].  I will defer to his primary team.

## 2010-12-25 NOTE — Assessment & Plan Note (Signed)
He had a recent negative stress perfusion study. No further cardiovascular testing is suggested.

## 2010-12-25 NOTE — Assessment & Plan Note (Signed)
I reviewed his last echo in his ALS was mild. I will follow this clinically.

## 2011-01-01 ENCOUNTER — Encounter: Payer: Self-pay | Admitting: Cardiovascular Disease

## 2011-01-04 ENCOUNTER — Encounter (INDEPENDENT_AMBULATORY_CARE_PROVIDER_SITE_OTHER): Payer: Medicare Other | Admitting: *Deleted

## 2011-01-04 DIAGNOSIS — M79606 Pain in leg, unspecified: Secondary | ICD-10-CM

## 2011-01-04 DIAGNOSIS — I6529 Occlusion and stenosis of unspecified carotid artery: Secondary | ICD-10-CM

## 2011-01-04 DIAGNOSIS — I739 Peripheral vascular disease, unspecified: Secondary | ICD-10-CM

## 2011-01-05 ENCOUNTER — Encounter: Payer: Self-pay | Admitting: Cardiology

## 2011-02-21 HISTORY — PX: CORONARY STENT PLACEMENT: SHX1402

## 2011-04-23 LAB — POCT I-STAT, CHEM 8
Calcium, Ion: 1.15
Creatinine, Ser: 1.2
Creatinine, Ser: 1.4
Glucose, Bld: 350 — ABNORMAL HIGH
HCT: 33 — ABNORMAL LOW
Hemoglobin: 11.2 — ABNORMAL LOW
Potassium: 4.3
Sodium: 136

## 2011-04-23 LAB — CBC
HCT: 29.7 — ABNORMAL LOW
HCT: 30.4 — ABNORMAL LOW
HCT: 32.1 — ABNORMAL LOW
HCT: 32.3 — ABNORMAL LOW
HCT: 30.9 — ABNORMAL LOW
Hemoglobin: 10.6 — ABNORMAL LOW
Hemoglobin: 10.9 — ABNORMAL LOW
Hemoglobin: 10.3 — ABNORMAL LOW
Hemoglobin: 9.8 — ABNORMAL LOW
MCHC: 33.2
MCHC: 33.6
MCHC: 33.3
MCV: 92.4
MCV: 93.2
MCV: 93.6
MCV: 95
Platelets: 145 — ABNORMAL LOW
Platelets: 170
Platelets: 177
Platelets: 147 — ABNORMAL LOW
Platelets: 150
Platelets: 167
RBC: 2.94 — ABNORMAL LOW
RDW: 14.7
RDW: 15
RDW: 15.1
RDW: 14.7
RDW: 15.2
WBC: 10.6 — ABNORMAL HIGH
WBC: 8.4

## 2011-04-23 LAB — BASIC METABOLIC PANEL
BUN: 23
BUN: 19
CO2: 27
Calcium: 9
Calcium: 9
Calcium: 9.3
Chloride: 105
Chloride: 106
Creatinine, Ser: 1.27
GFR calc Af Amer: >60
GFR calc non Af Amer: >60
Glucose, Bld: 130 — ABNORMAL HIGH
Potassium: 4.2
Potassium: 4.3
Sodium: 137
Sodium: 138

## 2011-04-23 LAB — GLUCOSE, CAPILLARY
Glucose-Capillary: 129 — ABNORMAL HIGH
Glucose-Capillary: 155 — ABNORMAL HIGH
Glucose-Capillary: 185 — ABNORMAL HIGH
Glucose-Capillary: 299 — ABNORMAL HIGH
Glucose-Capillary: 306 — ABNORMAL HIGH
Glucose-Capillary: 114 — ABNORMAL HIGH
Glucose-Capillary: 143 — ABNORMAL HIGH
Glucose-Capillary: 177 — ABNORMAL HIGH
Glucose-Capillary: 181 — ABNORMAL HIGH
Glucose-Capillary: 192 — ABNORMAL HIGH
Glucose-Capillary: 209 — ABNORMAL HIGH

## 2011-04-23 LAB — PROTIME-INR
INR: 1
INR: 1.1
Prothrombin Time: 13.6
Prothrombin Time: 14.9

## 2011-04-23 LAB — CK TOTAL AND CKMB (NOT AT ARMC)
CK, MB: 2.5
Relative Index: 1.7
Total CK: 204
Total CK: 443 — ABNORMAL HIGH

## 2011-04-23 LAB — LIPID PANEL
HDL: 35 — ABNORMAL LOW
Triglycerides: 112

## 2011-04-23 LAB — HEPARIN LEVEL (UNFRACTIONATED)
Heparin Unfractionated: 0.62
Heparin Unfractionated: 0.38
Heparin Unfractionated: 0.62
Heparin Unfractionated: 0.63

## 2011-04-23 LAB — COMPREHENSIVE METABOLIC PANEL
ALT: 25
Albumin: 3.5
Alkaline Phosphatase: 65
BUN: 16
CO2: 24
Calcium: 8.9
Calcium: 9.1
Creatinine, Ser: 1.03
GFR calc non Af Amer: 53 — ABNORMAL LOW
Glucose, Bld: 223 — ABNORMAL HIGH
Potassium: 3.9
Sodium: 138
Total Protein: 6

## 2011-04-23 LAB — DIFFERENTIAL
Basophils Absolute: 0
Basophils Absolute: 0.1
Basophils Relative: 0
Eosinophils Absolute: 0
Eosinophils Absolute: 0.1
Eosinophils Relative: 0
Eosinophils Relative: 1
Monocytes Absolute: 0.7

## 2011-04-23 LAB — MAGNESIUM
Magnesium: 1.5
Magnesium: 1.9

## 2011-04-23 LAB — CARDIAC PANEL(CRET KIN+CKTOT+MB+TROPI)
CK, MB: 3.9
CK, MB: 2
Relative Index: 1.3
Relative Index: 1.7
Total CK: 234 — ABNORMAL HIGH
Total CK: 300 — ABNORMAL HIGH
Total CK: 149
Troponin I: <0.01

## 2011-04-23 LAB — IRON AND TIBC
Iron: 50
TIBC: 361

## 2011-04-23 LAB — POCT CARDIAC MARKERS
Myoglobin, poc: 134
Troponin i, poc: <0.05
Troponin i, poc: <0.05

## 2011-04-30 LAB — APTT: aPTT: 28

## 2011-04-30 LAB — MAGNESIUM: Magnesium: 1.7

## 2011-04-30 LAB — I-STAT 8, (EC8 V) (CONVERTED LAB)
Acid-base deficit: 1
BUN: 23
Chloride: 101
HCT: 33 — ABNORMAL LOW
Hemoglobin: 11.2 — ABNORMAL LOW
Operator id: 196461
Potassium: 3.8
Sodium: 135

## 2011-04-30 LAB — CBC
HCT: 32.6 — ABNORMAL LOW
Hemoglobin: 11.2 — ABNORMAL LOW
MCHC: 34.4
MCV: 95.9
Platelets: 131 — ABNORMAL LOW
RBC: 3.45 — ABNORMAL LOW
RDW: 13.2
WBC: 5.4

## 2011-04-30 LAB — CK TOTAL AND CKMB (NOT AT ARMC)
CK, MB: 3.2
Relative Index: 1.7
Total CK: 193

## 2011-04-30 LAB — CARDIAC PANEL(CRET KIN+CKTOT+MB+TROPI)
CK, MB: 1.9
Total CK: 127
Troponin I: 0.02

## 2011-04-30 LAB — BASIC METABOLIC PANEL
CO2: 28
Calcium: 9.4
Calcium: 9.6
Creatinine, Ser: 1.2
GFR calc Af Amer: >60
GFR calc Af Amer: >60
GFR calc non Af Amer: >60
Glucose, Bld: 200 — ABNORMAL HIGH
Potassium: 4.3
Sodium: 135
Sodium: 137

## 2011-04-30 LAB — DIFFERENTIAL
Eosinophils Relative: 3
Lymphocytes Relative: 30
Lymphs Abs: 1.6

## 2011-04-30 LAB — COMPREHENSIVE METABOLIC PANEL
AST: 30
CO2: 22
Calcium: 8.5
Creatinine, Ser: 1.15
GFR calc Af Amer: >60
GFR calc non Af Amer: >60
Total Protein: 5.6 — ABNORMAL LOW

## 2011-04-30 LAB — D-DIMER, QUANTITATIVE: D-Dimer, Quant: 0.57 — ABNORMAL HIGH

## 2011-04-30 LAB — POCT I-STAT CREATININE: Creatinine, Ser: 1.2

## 2011-04-30 LAB — METANEPHRINES, PLASMA: Metanephrine, Free: <0.2 (ref <0.50)

## 2011-04-30 LAB — TROPONIN I: Troponin I: 0.03

## 2011-05-01 LAB — CBC
HCT: 27.8 — ABNORMAL LOW
HCT: 28.7 — ABNORMAL LOW
HCT: 30 — ABNORMAL LOW
HCT: 32 — ABNORMAL LOW
Hemoglobin: 10.3 — ABNORMAL LOW
Hemoglobin: 10.9 — ABNORMAL LOW
Hemoglobin: 9.4 — ABNORMAL LOW
MCHC: 34
MCV: 96.3
MCV: 97.6
MCV: 98.2
Platelets: 124 — ABNORMAL LOW
Platelets: 126 — ABNORMAL LOW
Platelets: 146 — ABNORMAL LOW
Platelets: 194
RBC: 2.98 — ABNORMAL LOW
RBC: 3.06 — ABNORMAL LOW
RDW: 13.1
RDW: 13.3
WBC: 5.7
WBC: 6.8
WBC: 6.8
WBC: 7.7

## 2011-05-01 LAB — URINALYSIS, ROUTINE W REFLEX MICROSCOPIC
Bilirubin Urine: NEGATIVE
Glucose, UA: NEGATIVE
Hgb urine dipstick: NEGATIVE
Ketones, ur: NEGATIVE
Protein, ur: NEGATIVE
Urobilinogen, UA: 0.2

## 2011-05-01 LAB — CK TOTAL AND CKMB (NOT AT ARMC)
CK, MB: 5.1 — ABNORMAL HIGH
Relative Index: 1.6
Relative Index: 1.6
Total CK: 223
Total CK: 319 — ABNORMAL HIGH

## 2011-05-01 LAB — BASIC METABOLIC PANEL
BUN: 22
BUN: 22
CO2: 23
CO2: 25
Calcium: 8.9
Calcium: 9.1
Chloride: 101
Chloride: 105
Chloride: 97
Creatinine, Ser: 1.04
Creatinine, Ser: 1.29
GFR calc Af Amer: >60
GFR calc Af Amer: >60
GFR calc Af Amer: >60
GFR calc non Af Amer: 55 — ABNORMAL LOW
GFR calc non Af Amer: >60
GFR calc non Af Amer: >60
Glucose, Bld: 165 — ABNORMAL HIGH
Potassium: 3.7
Potassium: 3.7
Potassium: 4.6
Sodium: 131 — ABNORMAL LOW
Sodium: 136
Sodium: 140

## 2011-05-01 LAB — COMPREHENSIVE METABOLIC PANEL
Albumin: 3.2 — ABNORMAL LOW
BUN: 18
Calcium: 8.5
Chloride: 106
Creatinine, Ser: 1.08
GFR calc non Af Amer: >60
Total Bilirubin: 0.7

## 2011-05-01 LAB — DIFFERENTIAL
Eosinophils Relative: 2
Lymphocytes Relative: 20
Lymphs Abs: 1.5
Monocytes Absolute: 0.8 — ABNORMAL HIGH
Monocytes Relative: 11
Neutro Abs: 5.1

## 2011-05-01 LAB — CARDIAC PANEL(CRET KIN+CKTOT+MB+TROPI)
CK, MB: 2.6
CK, MB: 2.6
Relative Index: 1.3
Total CK: 178
Total CK: 198
Troponin I: 0.02
Troponin I: 0.02

## 2011-05-01 LAB — POCT CARDIAC MARKERS
CKMB, poc: 3.6
Troponin i, poc: <0.05

## 2011-05-01 LAB — HEMOGLOBIN A1C
Hgb A1c MFr Bld: 7.2 — ABNORMAL HIGH
Mean Plasma Glucose: 179
Mean Plasma Glucose: 183

## 2011-05-01 LAB — MAGNESIUM: Magnesium: 1.8

## 2011-05-01 LAB — B-NATRIURETIC PEPTIDE (CONVERTED LAB): Pro B Natriuretic peptide (BNP): 367 — ABNORMAL HIGH

## 2011-05-01 LAB — ABO/RH: ABO/RH(D): O POS

## 2011-05-01 LAB — OCCULT BLOOD X 1 CARD TO LAB, STOOL
Fecal Occult Bld: NEGATIVE
Fecal Occult Bld: NEGATIVE

## 2011-05-01 LAB — TYPE AND SCREEN

## 2011-05-01 LAB — TROPONIN I
Troponin I: 0.03
Troponin I: 0.03

## 2011-05-01 LAB — LIPID PANEL: Cholesterol: 95

## 2011-06-22 ENCOUNTER — Encounter: Payer: Self-pay | Admitting: *Deleted

## 2011-06-26 ENCOUNTER — Ambulatory Visit (INDEPENDENT_AMBULATORY_CARE_PROVIDER_SITE_OTHER): Payer: Medicare Other | Admitting: Cardiology

## 2011-06-26 ENCOUNTER — Encounter: Payer: Self-pay | Admitting: Cardiology

## 2011-06-26 DIAGNOSIS — I779 Disorder of arteries and arterioles, unspecified: Secondary | ICD-10-CM

## 2011-06-26 DIAGNOSIS — I2510 Atherosclerotic heart disease of native coronary artery without angina pectoris: Secondary | ICD-10-CM

## 2011-06-26 DIAGNOSIS — I359 Nonrheumatic aortic valve disorder, unspecified: Secondary | ICD-10-CM

## 2011-06-26 DIAGNOSIS — E669 Obesity, unspecified: Secondary | ICD-10-CM

## 2011-06-26 DIAGNOSIS — I6529 Occlusion and stenosis of unspecified carotid artery: Secondary | ICD-10-CM

## 2011-06-26 DIAGNOSIS — E785 Hyperlipidemia, unspecified: Secondary | ICD-10-CM

## 2011-06-26 DIAGNOSIS — I10 Essential (primary) hypertension: Secondary | ICD-10-CM

## 2011-06-26 DIAGNOSIS — G4730 Sleep apnea, unspecified: Secondary | ICD-10-CM

## 2011-06-26 NOTE — Progress Notes (Signed)
HPI The patient presents for followup of CAD.  He had with chest pain with an admission at another hospital earlier this year.  There he had a stress perfusion study which demonstrated no ischemia.  However, more recently he had chest pain and was taken to the [facility].  He reports getting 2 stents. I don't have any of these records.  Of note he's had some mild renal insufficiency and very difficult to control hypertension. He has been seen by a nephrologist and urologist at the [facility].  Apparently they are planning a renal arteriogram and possible stenting.   I do not have these records.    Since his stents he said he has had no further chest discomfort.  The patient denies any new symptoms such as chest discomfort, neck or arm discomfort. There has been no new shortness of breath, PND or orthopnea. There have been no reported palpitations, presyncope or syncope.  He is trying to be active walking.    Allergies  Allergen Reactions  . Clopidogrel Bisulfate     Current Outpatient Prescriptions  Medication Sig Dispense Refill  . amLODipine (NORVASC) 10 MG tablet Take 10 mg by mouth daily.       Marland Kitchen aspirin EC 325 MG tablet Take 325 mg by mouth daily.        Marland Kitchen CITALOPRAM HYDROBROMIDE PO Take 40 mg by mouth. daily       . CLONAZEPAM ODT PO Take 0.5 mg by mouth Nightly. Take 2       . cloNIDine (CATAPRES) 0.1 MG tablet Take 0.1 mg by mouth 2 (two) times daily. Take 3       . doxycycline (VIBRAMYCIN) 50 MG capsule Take 50 mg by mouth daily.        . ferrous sulfate 325 (65 FE) MG tablet Take 325 mg by mouth daily with breakfast.        . furosemide (LASIX) 20 MG tablet Take 20 mg by mouth daily.        Marland Kitchen glucose blood test strip 1 each by Other route 2 (two) times daily. Use as instructed       . hydrALAZINE (APRESOLINE) 25 MG tablet Take 25 mg by mouth 2 (two) times daily.       . insulin glargine (LANTUS SOLOSTAR) 100 UNIT/ML injection Inject into the skin as directed.        . insulin regular  (NOVOLIN R) 100 UNIT/ML injection Inject into the skin as directed.        . isosorbide dinitrate (ISORDIL) 20 MG tablet Take 20 mg by mouth. Take 2 po tid       . lisinopril-hydrochlorothiazide (PRINZIDE,ZESTORETIC) 20-12.5 MG per tablet Take 1 tablet by mouth 2 (two) times daily.        . metoprolol (LOPRESSOR) 100 MG tablet Take 50 mg by mouth 2 (two) times daily. Take 3 tabs       . Multiple Vitamins-Minerals (CENTRUM SILVER PO) Take by mouth daily.        . niacin (NIASPAN) 1000 MG CR tablet Take 1,000 mg by mouth daily.        . nortriptyline (PAMELOR) 25 MG capsule Take 25 mg by mouth 2 (two) times daily.        Marland Kitchen omeprazole (PRILOSEC) 20 MG capsule Take 20 mg by mouth 2 (two) times daily.        . pramipexole (MIRAPEX) 0.125 MG tablet Take 0.125 mg by mouth daily.       Marland Kitchen  prazosin (MINIPRESS) 5 MG capsule Take 5 mg by mouth daily.        . simvastatin (ZOCOR) 10 MG tablet Take 10 mg by mouth at bedtime.        Marland Kitchen tetracycline (ACHROMYCIN,SUMYCIN) 250 MG capsule Take 250 mg by mouth. 3-4 times daily          Past Medical History  Diagnosis Date  . Hx of colonic polyps   . Coronary artery disease     left main was normal, LAD had 50-60% stenosis in the mid vessel and 60-70% stenosis in the distal vessel. The first diagonal had ostial 95% stenosis as it emerged from the stented area. Circumflex at 40% stenosis in the mid circumflex. The right coronary artery had PDA with 90% stenosis, which has progressed slightly form previous.  . Diverticulosis of colon   . GERD (gastroesophageal reflux disease)   . Hyperlipidemia   . Hypertension   . Osteoarthritis     hands/knees  . Hypertrophy (benign) of prostate     benign prostatic   . Rosacea   . Restless legs syndrome   . Sleep apnea   . PTSD (post-traumatic stress disorder)   . NIDDM (non-insulin dependent diabetes mellitus)   . Carotid artery stenosis     Past Surgical History  Procedure Date  . Knee surgery 1981    on right  knee    ROS:  Knee pain, joint pain, fatigue, disrupted sleep.  Otherwise as stated in the HPI and negative for all other systems.  PHYSICAL EXAM There were no vitals taken for this visit. GENERAL:  Well appearing HEENT:  Pupils equal round and reactive, fundi not visualized, oral mucosa unremarkable, edentulous NECK:  No jugular venous distention, waveform within normal limits, carotid upstroke brisk and symmetric, bilateral carotid bruits, no thyromegaly LYMPHATICS:  No cervical, inguinal adenopathy LUNGS:  Clear to auscultation bilaterally BACK:  No CVA tenderness CHEST:  Unremarkable HEART:  PMI not displaced or sustained,S1 and S2 within normal limits, no S3, no S4, no clicks, no rubs, 3/6 early peaking systolic murmur ABD:  Flat, positive bowel sounds normal in frequency in pitch, no bruits, no rebound, no guarding, no midline pulsatile mass, no hepatomegaly, no splenomegaly, obese EXT:  2 plus pulses upper, absent DP/PT bilateral, no edema, no cyanosis no clubbing SKIN:  No rashes no nodules NEURO:  Cranial nerves II through XII grossly intact, motor grossly intact throughout PSYCH:  Cognitively intact, oriented to person place and time  EKG:  Sinus rhythm, rate 72, axis within normal limits, intervals within normal limits, no acute ST-T wave changes.  06/26/2011  ASSESSMENT AND PLAN

## 2011-06-26 NOTE — Patient Instructions (Signed)
The current medical regimen is effective;  continue present plan and medications.  Your physician has requested that you have a carotid duplex in 6 months. This test is an ultrasound of the carotid arteries in your neck. It looks at blood flow through these arteries that supply the brain with blood. Allow one hour for this exam. There are no restrictions or special instructions.  Follow up in 6 months with Dr Hochrein.  You will receive a letter in the mail 2 months before you are due.  Please call us when you receive this letter to schedule your follow up appointment.  

## 2011-06-26 NOTE — Assessment & Plan Note (Signed)
The patient understands the need to lose weight with diet and exercise. We have discussed specific strategies for this.  

## 2011-06-26 NOTE — Assessment & Plan Note (Signed)
I have asked him to discuss this with his doctors who are prescribing the CPAP since he continues to have disrupted sleep.

## 2011-06-26 NOTE — Assessment & Plan Note (Signed)
I reviewed the carotids done most recently in June of this year. He will have scheduled followup June of 2013.

## 2011-06-26 NOTE — Assessment & Plan Note (Addendum)
His blood pressure is not at all control despite multiple medications. I would defer the workup of his renal arteries to the [place].  Again I would suggest a sleep doctor appointment to make sure his CPAP works.

## 2011-06-26 NOTE — Assessment & Plan Note (Signed)
The blood pressure is at target. No change in medications is indicated. We will continue with therapeutic lifestyle changes (TLC).  

## 2011-06-26 NOTE — Assessment & Plan Note (Signed)
He has mild AS.  I will try to get a copy of the most recent echo.

## 2011-06-26 NOTE — Assessment & Plan Note (Signed)
He has no new symptoms.  I will try to get the records from the [place] to understand his most recent coronary intervention and anatomy. We will continue with risk reduction.

## 2011-09-03 ENCOUNTER — Ambulatory Visit (INDEPENDENT_AMBULATORY_CARE_PROVIDER_SITE_OTHER): Payer: Medicare Other | Admitting: Internal Medicine

## 2011-09-03 ENCOUNTER — Encounter: Payer: Self-pay | Admitting: Internal Medicine

## 2011-09-03 DIAGNOSIS — I2510 Atherosclerotic heart disease of native coronary artery without angina pectoris: Secondary | ICD-10-CM

## 2011-09-03 DIAGNOSIS — I10 Essential (primary) hypertension: Secondary | ICD-10-CM

## 2011-09-03 DIAGNOSIS — N63 Unspecified lump in unspecified breast: Secondary | ICD-10-CM

## 2011-09-03 DIAGNOSIS — N6310 Unspecified lump in the right breast, unspecified quadrant: Secondary | ICD-10-CM | POA: Insufficient documentation

## 2011-09-03 NOTE — Assessment & Plan Note (Signed)
Stent ~6 months ago No history of CHF so not sure why on spironolactone

## 2011-09-03 NOTE — Assessment & Plan Note (Signed)
BP Readings from Last 3 Encounters:  09/03/11 112/62  06/26/11 156/58  12/25/10 104/48   Good control now

## 2011-09-03 NOTE — Progress Notes (Signed)
Subjective:    Patient ID: [name] Ball, male    DOB: [birth year], 71 y.o.   MRN: [id number]  HPI Brought in [place] list of meds Not even close to what we had--and what Dr Antoine Poche was aware of  Had cardiac stent of [facility] ~6 months ago Heart is doing okay  No history of congestive heart failure Never told his heart function wasn't normal  Still has some leg swelling Controlled with meds  He has noted a lump at bottom of left areola Apparent last week Nipples are sore  No nipple discharge  On spironolactone Dose increased to 50mg  ---?a couple of months ago  Current Outpatient Prescriptions on File Prior to Visit  Medication Sig Dispense Refill  . amLODipine (NORVASC) 10 MG tablet Take 10 mg by mouth daily.       Marland Kitchen doxycycline (VIBRAMYCIN) 50 MG capsule Take 50 mg by mouth daily.        . ferrous sulfate 325 (65 FE) MG tablet Take 325 mg by mouth daily with breakfast.        . glucose blood test strip 1 each by Other route 2 (two) times daily. Use as instructed       . insulin glargine (LANTUS SOLOSTAR) 100 UNIT/ML injection Inject 46 Units into the skin 3 (three) times daily.       . insulin regular (NOVOLIN R) 100 UNIT/ML injection Inject 75 Units into the skin daily.       . isosorbide dinitrate (ISORDIL) 20 MG tablet Take 20 mg by mouth 3 (three) times daily.       Marland Kitchen lisinopril-hydrochlorothiazide (PRINZIDE,ZESTORETIC) 20-12.5 MG per tablet Take 1 tablet by mouth 2 (two) times daily.        . Multiple Vitamins-Minerals (CENTRUM SILVER PO) Take by mouth daily.        . niacin (NIASPAN) 1000 MG CR tablet Take 1,000 mg by mouth daily.        . nortriptyline (PAMELOR) 25 MG capsule Take 25 mg by mouth 2 (two) times daily.        Marland Kitchen omeprazole (PRILOSEC) 20 MG capsule Take 20 mg by mouth 2 (two) times daily.        Marland Kitchen oxybutynin (DITROPAN-XL) 10 MG 24 hr tablet Take 20 mg by mouth daily.       . pramipexole (MIRAPEX) 0.125 MG tablet Take 0.125 mg by mouth at bedtime.       . prazosin  (MINIPRESS) 5 MG capsule Take 1 by mouth in the morning and 2 tabs at bedtime        Allergies  Allergen Reactions  . Clopidogrel Bisulfate   . Venlafaxine Other (See Comments)    Generalized aches and pains  . Tramadol Rash    Past Medical History  Diagnosis Date  . Hx of colonic polyps   . Coronary artery disease     left main was normal, LAD had 50-60% stenosis in the mid vessel and 60-70% stenosis in the distal vessel. The first diagonal had ostial 95% stenosis as it emerged from the stented area. Circumflex at 40% stenosis in the mid circumflex. The right coronary artery had PDA with 90% stenosis, which has progressed slightly form previous.  . Diverticulosis of colon   . GERD (gastroesophageal reflux disease)   . Hyperlipidemia   . Hypertension   . Osteoarthritis     hands/knees  . Hypertrophy (benign) of prostate     benign prostatic   . Rosacea   .  Restless legs syndrome   . Sleep apnea   . PTSD (post-traumatic stress disorder)   . NIDDM (non-insulin dependent diabetes mellitus)   . Carotid artery stenosis     Past Surgical History  Procedure Date  . Knee surgery 1981    on right knee  . Coronary stent placement 8/12    at [facility]    Family History  Problem Relation Age of Onset  . Heart attack Mother   . Diabetes Brother     History   Social History  . Marital Status: Married    Spouse Name: N/A    Number of Children: N/A  . Years of Education: N/A   Occupational History  . Not on file.   Social History Main Topics  . Smoking status: Former Smoker    Quit date: 07/23/1968  . Smokeless tobacco: Current User  . Alcohol Use: No  . Drug Use: No  . Sexually Active: Not on file   Other Topics Concern  . Not on file   Social History Narrative   Has a 40 pack a year history of tobacco abuse, quitting about 25 years ago. Denies alcohol or drug use. Does daily exercise in gym, stationary bike and light weights. Patient signed designated party release  form and gives [name] Ball [phone] access to medical records. Can leave message on answering machine [phone] or cell [phone]   Review of Systems Goes to gym--takes his time and doesn't get dyspnea No chest pain now No orthopnea or PND    Objective:   Physical Exam  Constitutional: He appears well-developed and well-nourished. No distress.  Neck: Normal range of motion. No thyromegaly present.  Cardiovascular: Normal rate and regular rhythm.  Exam reveals no gallop.   Murmur heard.      Gr 3/6 systolic murmur heard throughout precordium  Pulmonary/Chest: Effort normal and breath sounds normal. No respiratory distress. He has no wheezes. He has no rales.  Genitourinary:       Right breast with no masses Left breast has subareolar cystic type tissue ---  ~3-4cm in diameter. Slight tenderness  Musculoskeletal: He exhibits no edema and no tenderness.  Lymphadenopathy:    He has no cervical adenopathy.  Psychiatric: He has a normal mood and affect. His behavior is normal.          Assessment & Plan:

## 2011-09-03 NOTE — Patient Instructions (Signed)
I would recommend that you stop the spironolactone to see if that helps shrink the breast lump. If the lump doesn't go away, or improve significantly, after about 1 month, please call for referral to surgeon

## 2011-09-03 NOTE — Assessment & Plan Note (Signed)
likely from spironolactone but not clear cut Needs surgical eval if this is persistent

## 2011-09-13 ENCOUNTER — Telehealth: Payer: Self-pay | Admitting: *Deleted

## 2011-09-13 NOTE — Telephone Encounter (Signed)
That sounds appropriate  Okay to set up repeat potassium (probably best to do met b)----?tomorrow morning or Monday. Needs to have appt next week or so to recheck BP and fluid status off those meds  Page doctor and get fax number so we can send her my notes in the future as well as the labs we will draw

## 2011-09-13 NOTE — Telephone Encounter (Signed)
Wife calling asking for pt to have his potassium checked here, per wife it was 6.1 at the [place]. I advised wife we would need a copy of the labs, she called the [place] and spoke with the physican and she asked Dr.Letvak to page her and she will give him a verbal result,  Dr.Bowman [phone], also per wife Dr. Orson Slick asked them to hold the lisinopril/ HCTZ and spironolactone and increase fluids.

## 2011-09-13 NOTE — Telephone Encounter (Signed)
Lyla Son spoke with patient and his wife and scheduled appt. Wife will get fax number from [place]

## 2011-09-14 ENCOUNTER — Other Ambulatory Visit (INDEPENDENT_AMBULATORY_CARE_PROVIDER_SITE_OTHER): Payer: Medicare Other

## 2011-09-14 DIAGNOSIS — E876 Hypokalemia: Secondary | ICD-10-CM

## 2011-09-14 LAB — BASIC METABOLIC PANEL
BUN: 41 mg/dL — ABNORMAL HIGH (ref 6–23)
CO2: 19 mEq/L (ref 19–32)
Calcium: 8.9 mg/dL (ref 8.4–10.5)
GFR: 45.34 mL/min — ABNORMAL LOW (ref >60.00)
Glucose, Bld: 279 mg/dL — ABNORMAL HIGH (ref 70–99)
Sodium: 132 mEq/L — ABNORMAL LOW (ref 135–145)

## 2011-09-14 NOTE — Progress Notes (Signed)
Addended by: Alvina Chou on: 09/14/2011 10:24 AM   Modules accepted: Orders

## 2011-09-14 NOTE — Progress Notes (Signed)
Addended by: Alvina Chou on: 09/14/2011 10:31 AM   Modules accepted: Orders

## 2012-01-23 ENCOUNTER — Encounter (INDEPENDENT_AMBULATORY_CARE_PROVIDER_SITE_OTHER): Payer: Medicare Other

## 2012-01-23 DIAGNOSIS — R0989 Other specified symptoms and signs involving the circulatory and respiratory systems: Secondary | ICD-10-CM

## 2012-01-23 DIAGNOSIS — I6529 Occlusion and stenosis of unspecified carotid artery: Secondary | ICD-10-CM

## 2012-01-25 ENCOUNTER — Telehealth: Payer: Self-pay | Admitting: Cardiology

## 2012-01-25 NOTE — Telephone Encounter (Signed)
[place] in Pt form" Cardiac cath dropped off from [place]" Placed in [place] Doc Box 01/25/12/KM

## 2012-03-18 ENCOUNTER — Encounter: Payer: Self-pay | Admitting: Cardiology

## 2012-03-18 ENCOUNTER — Ambulatory Visit (INDEPENDENT_AMBULATORY_CARE_PROVIDER_SITE_OTHER): Payer: Medicare Other | Admitting: Cardiology

## 2012-03-18 VITALS — BP 132/60 | HR 79 | Ht 72.0 in | Wt 253.0 lb

## 2012-03-18 DIAGNOSIS — R0789 Other chest pain: Secondary | ICD-10-CM

## 2012-03-18 NOTE — Progress Notes (Signed)
HPI The patient presents for followup of CAD.  He had PCI at the [place] last year. He had been doing well and actually has lost about 20 pounds dieting and exercising. He exercises at the [facility]. He can spell a bicycle or do other aerobic activities and does not get chest discomfort. However, he's been getting chest discomfort but he's been swimming. He gets more breathless than he thinks he should. He describes some discomfort that is somewhat similar to his previous angina though not as severe. He does not get any nausea or vomiting but he does describe bilateral pains in his forearms. It might be 5/10 in intensity and take a few minutes to go away. The other day he took 2 nitroglycerin without significant improvement. He has not had any resting complaints.  Allergies  Allergen Reactions  . Clopidogrel Bisulfate   . Venlafaxine Other (See Comments)    Generalized aches and pains  . Tramadol Rash    Current Outpatient Prescriptions  Medication Sig Dispense Refill  . alprostadil (EDEX) 20 MCG injection 20 mcg by Intracavitary route as needed. use no more than 3 times per week      . ammonium lactate (AMLACTIN) 12 % cream Apply topically as needed.      . bacitracin 500 UNIT/GM ointment Apply 1 application topically 2 (two) times daily.      . chlorhexidine (PERIDEX) 0.12 % solution Use as directed 15 mLs in the mouth or throat 2 (two) times daily.      . citalopram (CELEXA) 40 MG tablet Take 40 mg by mouth daily.      . clonazePAM (KLONOPIN) 1 MG tablet Take 0.5 mg by mouth at bedtime.      Marland Kitchen doxycycline (VIBRAMYCIN) 50 MG capsule Take 50 mg by mouth daily.        Marland Kitchen eplerenone (INSPRA) 25 MG tablet Take 25 mg by mouth daily.      . ferrous sulfate 325 (65 FE) MG tablet Take 325 mg by mouth daily with breakfast.        . fish oil-omega-3 fatty acids 1000 MG capsule Take 2 g by mouth daily.      Marland Kitchen glucose blood test strip 1 each by Other route 2 (two) times daily. Use as instructed       .  hydrALAZINE (APRESOLINE) 50 MG tablet Take 50 mg by mouth 3 (three) times daily.      . hydrochlorothiazide (HYDRODIURIL) 25 MG tablet Take 25 mg by mouth daily.      . hydroxypropyl methylcellulose (ISOPTO TEARS) 2.5 % ophthalmic solution Place 1 drop into both eyes 4 (four) times daily as needed.      . insulin glargine (LANTUS SOLOSTAR) 100 UNIT/ML injection Inject 46 Units into the skin 3 (three) times daily.       . insulin regular (NOVOLIN R) 100 UNIT/ML injection Inject 75 Units into the skin daily.       . isosorbide dinitrate (ISORDIL) 20 MG tablet Take 20 mg by mouth 3 (three) times daily.       . Multiple Vitamins-Minerals (CENTRUM SILVER PO) Take by mouth daily.        . niacin (NIASPAN) 1000 MG CR tablet Take 1,000 mg by mouth daily.        . nicotine polacrilex (COMMIT) 2 MG lozenge Place 2 mg inside cheek as needed.      . nitroGLYCERIN (NITROSTAT) 0.4 MG SL tablet Place 0.4 mg under the tongue every 5 (five)  minutes as needed.      . nortriptyline (PAMELOR) 25 MG capsule Take 25 mg by mouth 2 (two) times daily.        Marland Kitchen omeprazole (PRILOSEC) 20 MG capsule Take 20 mg by mouth 2 (two) times daily.        Marland Kitchen oxybutynin (DITROPAN-XL) 10 MG 24 hr tablet Take 20 mg by mouth daily.       . pramipexole (MIRAPEX) 0.125 MG tablet Take 0.125 mg by mouth at bedtime.       . prazosin (MINIPRESS) 5 MG capsule Take 1 by mouth in the morning and 2 tabs at bedtime      . selenium sulfide (SELSUN) 2.5 % shampoo Apply 1 application topically daily as needed.      . simvastatin (ZOCOR) 20 MG tablet Take 20 mg by mouth at bedtime.      . sodium fluoride (FLUORISHIELD) 1.1 % GEL dental gel Place 1 application onto teeth at bedtime.      Marland Kitchen spironolactone (ALDACTONE) 50 MG tablet Take 50 mg by mouth daily.      Marland Kitchen terbinafine (LAMISIL) 1 % cream Apply 1 application topically 2 (two) times daily.      Marland Kitchen acetaminophen (TYLENOL) 325 MG tablet Take 975 mg by mouth 3 (three) times daily.      Marland Kitchen amLODipine  (NORVASC) 10 MG tablet Take 10 mg by mouth daily.       Marland Kitchen ascorbic acid (VITAMIN C) 500 MG tablet Take 500 mg by mouth daily.      Marland Kitchen aspirin 81 MG tablet Take 81 mg by mouth daily.      . carvedilol (COREG) 25 MG tablet Take 25 mg by mouth 2 (two) times daily with a meal.        Past Medical History  Diagnosis Date  . Hx of colonic polyps   . Coronary artery disease     left main was normal, LAD had 50-60% stenosis in the mid vessel and 60-70% stenosis in the distal vessel. The first diagonal had ostial 95% stenosis as it emerged from the stented area. Circumflex at 40% stenosis in the mid circumflex. The right coronary artery had PDA with 90% stenosis, which has progressed slightly form previous.  . Diverticulosis of colon   . GERD (gastroesophageal reflux disease)   . Hyperlipidemia   . Hypertension   . Osteoarthritis     hands/knees  . Hypertrophy (benign) of prostate     benign prostatic   . Rosacea   . Restless legs syndrome   . Sleep apnea   . PTSD (post-traumatic stress disorder)   . NIDDM (non-insulin dependent diabetes mellitus)   . Carotid artery stenosis     Past Surgical History  Procedure Date  . Knee surgery 1981    on right knee  . Coronary stent placement 8/12    at [facility]    ROS:  Knee pain, joint pain, fatigue, disrupted sleep.  Otherwise as stated in the HPI and negative for all other systems.  PHYSICAL EXAM BP 132/60  Pulse 79  Ht 6' (1.829 m)  Wt 253 lb (114.76 kg)  BMI 34.31 kg/m2 GENERAL:  Well appearing HEENT:  Pupils equal round and reactive, fundi not visualized, oral mucosa unremarkable, edentulous NECK:  No jugular venous distention, waveform within normal limits, carotid upstroke brisk and symmetric, bilateral carotid bruits, no thyromegaly LYMPHATICS:  No cervical, inguinal adenopathy LUNGS:  Clear to auscultation bilaterally BACK:  No CVA tenderness CHEST:  Unremarkable  HEART:  PMI not displaced or sustained,S1 and S2 within normal limits,  no S3, no S4, no clicks, no rubs, 3/6 early peaking systolic murmur ABD:  Flat, positive bowel sounds normal in frequency in pitch, no bruits, no rebound, no guarding, no midline pulsatile mass, no hepatomegaly, no splenomegaly, obese EXT:  2 plus pulses upper, absent DP/PT bilateral, no edema, no cyanosis no clubbing SKIN:  No rashes no nodules NEURO:  Cranial nerves II through XII grossly intact, motor grossly intact throughout PSYCH:  Cognitively intact, oriented to person place and time  EKG:  Sinus rhythm, rate 72, axis within normal limits, intervals within normal limits, no acute ST-T wave changes.  03/18/2012  ASSESSMENT AND PLAN  CORONARY ARTERY DISEASE - Given the new symptoms he will need exercise perfusion testing. He will remain on the medicines as listed until that time. Further evaluation will be based on these results.   HYPERTENSION - The blood pressure is at target. No change in medications is indicated. We will continue with therapeutic lifestyle changes (TLC).    OBESITY, UNSPECIFIED - I am very proud of his 20 pound weight loss and encourage more of the same.   HYPERLIPIDEMIA -  This is followed at the [place]. He has been taken off of his statin and more recent data would suggest no benefit to the fish oil. This can be stopped. Other for management to his primary providers.    Aortic valve disorders - He had an echo done recently and I will obtain these results. He says his AST is still mild.   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 He will have scheduled followup July theof 2014.

## 2012-03-18 NOTE — Patient Instructions (Addendum)
The current medical regimen is effective;  continue present plan and medications.  Your physician has requested that you have a lexiscan myoview. For further information please visit [email or web address] Please follow instruction sheet, as given.  Follow up in December as planned

## 2012-03-25 ENCOUNTER — Telehealth (HOSPITAL_COMMUNITY): Payer: Self-pay

## 2012-03-25 NOTE — Telephone Encounter (Signed)
Spoke with the patient about myoview instructions for tomorrow, and updated correct insulin dosage.Irean Hong, RN.

## 2012-03-26 ENCOUNTER — Ambulatory Visit (HOSPITAL_COMMUNITY): Payer: Medicare Other | Attending: Cardiovascular Disease | Admitting: Radiology

## 2012-03-26 VITALS — BP 162/65 | HR 58 | Ht 72.0 in | Wt 248.0 lb

## 2012-03-26 DIAGNOSIS — R0989 Other specified symptoms and signs involving the circulatory and respiratory systems: Secondary | ICD-10-CM | POA: Insufficient documentation

## 2012-03-26 DIAGNOSIS — Z8249 Family history of ischemic heart disease and other diseases of the circulatory system: Secondary | ICD-10-CM | POA: Insufficient documentation

## 2012-03-26 DIAGNOSIS — I10 Essential (primary) hypertension: Secondary | ICD-10-CM | POA: Insufficient documentation

## 2012-03-26 DIAGNOSIS — R0789 Other chest pain: Secondary | ICD-10-CM | POA: Insufficient documentation

## 2012-03-26 DIAGNOSIS — F17200 Nicotine dependence, unspecified, uncomplicated: Secondary | ICD-10-CM

## 2012-03-26 DIAGNOSIS — I2510 Atherosclerotic heart disease of native coronary artery without angina pectoris: Secondary | ICD-10-CM

## 2012-03-26 DIAGNOSIS — R0609 Other forms of dyspnea: Secondary | ICD-10-CM | POA: Insufficient documentation

## 2012-03-26 DIAGNOSIS — E785 Hyperlipidemia, unspecified: Secondary | ICD-10-CM

## 2012-03-26 DIAGNOSIS — Z87891 Personal history of nicotine dependence: Secondary | ICD-10-CM | POA: Insufficient documentation

## 2012-03-26 DIAGNOSIS — R079 Chest pain, unspecified: Secondary | ICD-10-CM

## 2012-03-26 DIAGNOSIS — R0602 Shortness of breath: Secondary | ICD-10-CM

## 2012-03-26 DIAGNOSIS — E1149 Type 2 diabetes mellitus with other diabetic neurological complication: Secondary | ICD-10-CM

## 2012-03-26 DIAGNOSIS — E119 Type 2 diabetes mellitus without complications: Secondary | ICD-10-CM | POA: Insufficient documentation

## 2012-03-26 MED ORDER — TECHNETIUM TC 99M TETROFOSMIN IV KIT
11.0000 | PACK | Freq: Once | INTRAVENOUS | Status: AC | PRN
  Administered 2012-03-26: 11 via INTRAVENOUS

## 2012-03-26 MED ORDER — TECHNETIUM TC 99M TETROFOSMIN IV KIT
32.9000 | PACK | Freq: Once | INTRAVENOUS | Status: AC | PRN
  Administered 2012-03-26: 32.9 via INTRAVENOUS

## 2012-03-26 MED ORDER — REGADENOSON 0.4 MG/5ML IV SOLN
0.4000 mg | Freq: Once | INTRAVENOUS | Status: AC
  Administered 2012-03-26: 0.4 mg via INTRAVENOUS

## 2012-03-26 NOTE — Progress Notes (Signed)
[facility] SITE 3 NUCLEAR MED [address] [place] [address] [phone]  Cardiology Nuclear Med Study  [name] Ball is a 71 y.o. male     MRN : [id number]     DOB: 02/19/1941  Procedure Date: 03/26/2012  Nuclear Med Background Indication for Stress Test:  Evaluation for Ischemia and PTCA/Stent Patency History:  Multiple PCI's; '09 Cath:Patent Stents, n/o CAD; '11 Echo:EF=60%, mild AS; '12 UXL:KGMWNU, EF=62% Cardiac Risk Factors: Carotid Disease, Family History - CAD, History of Smoking, Hypertension, IDDM Type 2, Lipids and Obesity  Symptoms:  Chest Pressure with Exertion (last episode of chest discomfort was about 2-3 weeks ago) and DOE   Nuclear Pre-Procedure Caffeine/Decaff Intake:  None x 12 hrs NPO After: 5:30pm   Lungs:  Clear. O2 Sat: 94% on room air. IV 0.9% NS with Angio Cath:  20g  IV Site: R Hand x 1, tolerated well IV Started by:  [name] Hong, RN  Chest Size (in):  48 Cup Size: n/a  Height: 6' (1.829 m)  Weight:  248 lb (112.492 kg)  BMI:  Body mass index is 33.63 kg/(m^2). Tech Comments: FBS was 161 at 7:00 am per patient, no insulin today. Held coreg x 24 hrs    Nuclear Med Study 1 or 2 day study: 1 day  Stress Test Type:  Treadmill/Lexiscan  Reading MD: [name] Miss, MD  Order Authorizing Provider:  [name], MD  Resting Radionuclide: Technetium 24m Tetrofosmin  Resting Radionuclide Dose: 11.0 mCi   Stress Radionuclide:  Technetium 17m Tetrofosmin  Stress Radionuclide Dose: 32.9 mCi           Stress Protocol Rest HR: 58 Stress HR: 82  Rest BP: 162/65 Stress BP: 156/48  Exercise Time (min): 5:04 METS: 5.0   Predicted Max HR: 148 bpm % Max HR: 55.41 bpm Rate Pressure Product: 27253   Dose of Adenosine (mg):  n/a Dose of Lexiscan: 0.4 mg  Dose of Atropine (mg): n/a Dose of Dobutamine: n/a mcg/kg/min (at max HR)  Stress Test Technologist: [name] Ball, CMA-N  Nuclear Technologist:  [name] Ball, CNMT     Rest  Procedure:  Myocardial perfusion imaging was performed at rest 45 minutes following the intravenous administration of Technetium 43m Tetrofosmin.  Rest ECG: No acute changes.  Stress Procedure:  The patient attempted to walk the treadmill utilizing the Bruce protocol for four minutes, but was unable to reach his target heart rate.  He was then given IV Lexiscan 0.4 mg over 15-seconds with concurrent low level exercise and then Technetium 93m Tetrofosmin was injected at 30-seconds.  Treadmill was immediately stopped due to audible wheezes. There were no diagnostic ST-T wave changes with Lexiscan. He did c/o chest tightness in recovery, 7/10.  He also had inspiratory and expiratory wheezes immediately post exercise and he was given Albuterol inhaler 2 puffs with relief.  Quantitative spect images were obtained after a 45-minute delay.  Stress ECG: No significant change from baseline ECG  QPS Raw Data Images:  Normal; no motion artifact; normal heart/lung ratio. Stress Images:  There is mild apical thinning with normal uptake in other regions. Rest Images:  There is mild apical thinning with normal uptake in other regions. Subtraction (SDS):  No evidence of ischemia. Transient Ischemic Dilatation (Normal <1.22):  0.99 Lung/Heart Ratio (Normal <0.45):  0.34  Quantitative Gated Spect Images QGS EDV:  137 ml QGS ESV:  61 ml  Impression Exercise Capacity:  Lexiscan with low level exercise. BP Response:  Normal  blood pressure response. Clinical Symptoms:  Mild chest pain/dyspnea. ECG Impression:  No significant ST segment change suggestive of ischemia. Comparison with Prior Nuclear Study: No images to compare  Overall Impression:  Normal stress nuclear study.  There is no ischemia.    LV Ejection Fraction: 56%.  LV Wall Motion:  NL LV Function; NL Wall Motion.    [name], [name] Ball., MD, [facility] 03/26/2012, 4:59 PM Office - [phone] Pager [phone]

## 2012-05-15 ENCOUNTER — Emergency Department: Payer: Self-pay | Admitting: Emergency Medicine

## 2012-05-15 LAB — BASIC METABOLIC PANEL
Anion Gap: 11 (ref 7–16)
BUN: 34 mg/dL — ABNORMAL HIGH (ref 7–18)
Chloride: 107 mmol/L (ref 98–107)
Co2: 20 mmol/L — ABNORMAL LOW (ref 21–32)
Creatinine: 1.65 mg/dL — ABNORMAL HIGH (ref 0.60–1.30)
Osmolality: 286 (ref 275–301)
Potassium: 4.5 mmol/L (ref 3.5–5.1)
Sodium: 138 mmol/L (ref 136–145)

## 2012-05-15 LAB — CBC
HGB: 11.1 g/dL — ABNORMAL LOW (ref 13.0–18.0)
Platelet: 146 10*3/uL — ABNORMAL LOW (ref 150–440)
RBC: 3.11 10*6/uL — ABNORMAL LOW (ref 4.40–5.90)
WBC: 10.1 10*3/uL (ref 3.8–10.6)

## 2012-05-15 LAB — TROPONIN I: Troponin-I: <0.02 ng/mL

## 2012-05-17 ENCOUNTER — Emergency Department: Payer: Self-pay | Admitting: Emergency Medicine

## 2012-05-19 LAB — TROPONIN I: Troponin-I: <0.02 ng/mL

## 2012-05-19 LAB — COMPREHENSIVE METABOLIC PANEL
Albumin: 3.8 g/dL (ref 3.4–5.0)
Alkaline Phosphatase: 59 U/L (ref 50–136)
Anion Gap: 12 (ref 7–16)
BUN: 31 mg/dL — ABNORMAL HIGH (ref 7–18)
Calcium, Total: 8.5 mg/dL (ref 8.5–10.1)
Chloride: 106 mmol/L (ref 98–107)
EGFR (Non-African Amer.): 42 — ABNORMAL LOW
Glucose: 169 mg/dL — ABNORMAL HIGH (ref 65–99)
Osmolality: 292 (ref 275–301)
Potassium: 4.6 mmol/L (ref 3.5–5.1)
Sodium: 141 mmol/L (ref 136–145)
Total Protein: 6.5 g/dL (ref 6.4–8.2)

## 2012-05-19 LAB — CBC
HGB: 8.9 g/dL — ABNORMAL LOW (ref 13.0–18.0)
Platelet: 128 10*3/uL — ABNORMAL LOW (ref 150–440)
RDW: 13.6 % (ref 11.5–14.5)
WBC: 7.6 10*3/uL (ref 3.8–10.6)

## 2012-05-19 LAB — URINALYSIS, COMPLETE
Glucose,UR: NEGATIVE mg/dL (ref 0–75)
Ketone: NEGATIVE
Leukocyte Esterase: NEGATIVE
Nitrite: NEGATIVE
Protein: 30
Specific Gravity: 1.006 (ref 1.003–1.030)

## 2012-05-19 LAB — CK TOTAL AND CKMB (NOT AT ARMC)
CK, Total: 319 U/L — ABNORMAL HIGH (ref 35–232)
CK-MB: 2.7 ng/mL (ref 0.5–3.6)

## 2012-05-20 ENCOUNTER — Observation Stay: Payer: Self-pay | Admitting: Internal Medicine

## 2012-05-20 DIAGNOSIS — I517 Cardiomegaly: Secondary | ICD-10-CM

## 2012-05-20 DIAGNOSIS — R079 Chest pain, unspecified: Secondary | ICD-10-CM

## 2012-05-20 LAB — CK TOTAL AND CKMB (NOT AT ARMC)
CK, Total: 249 U/L — ABNORMAL HIGH (ref 35–232)
CK-MB: 2 ng/mL (ref 0.5–3.6)
CK-MB: 1.9 ng/mL (ref 0.5–3.6)

## 2012-05-20 LAB — TROPONIN I: Troponin-I: <0.02 ng/mL

## 2012-05-21 LAB — CBC WITH DIFFERENTIAL/PLATELET
Basophil #: 0.1 10*3/uL (ref 0.0–0.1)
Eosinophil #: 0.2 10*3/uL (ref 0.0–0.7)
Lymphocyte #: 1.5 10*3/uL (ref 1.0–3.6)
Lymphocyte %: 22.7 %
MCHC: 34.5 g/dL (ref 32.0–36.0)
MCV: 100 fL (ref 80–100)
Monocyte #: 0.9 x10 3/mm (ref 0.2–1.0)
Monocyte %: 13.9 %
Neutrophil #: 4.1 10*3/uL (ref 1.4–6.5)
Platelet: 131 10*3/uL — ABNORMAL LOW (ref 150–440)
RDW: 13.7 % (ref 11.5–14.5)
WBC: 6.8 10*3/uL (ref 3.8–10.6)

## 2012-05-21 LAB — BASIC METABOLIC PANEL
Anion Gap: 9 (ref 7–16)
Chloride: 106 mmol/L (ref 98–107)
Co2: 28 mmol/L (ref 21–32)
Creatinine: 1.38 mg/dL — ABNORMAL HIGH (ref 0.60–1.30)
EGFR (Non-African Amer.): 50 — ABNORMAL LOW
Glucose: 123 mg/dL — ABNORMAL HIGH (ref 65–99)
Sodium: 143 mmol/L (ref 136–145)

## 2012-05-21 LAB — LIPID PANEL
Cholesterol: 127 mg/dL (ref 0–200)
HDL Cholesterol: 29 mg/dL — ABNORMAL LOW (ref 40–60)
Triglycerides: 187 mg/dL (ref 0–200)
VLDL Cholesterol, Calc: 37 mg/dL (ref 5–40)

## 2012-05-22 ENCOUNTER — Observation Stay: Payer: Self-pay | Admitting: Internal Medicine

## 2012-05-22 LAB — DRUG SCREEN, URINE
Barbiturates, Ur Screen: NEGATIVE (ref ?–200)
Cannabinoid 50 Ng, Ur ~~LOC~~: NEGATIVE (ref ?–50)
Cocaine Metabolite,Ur ~~LOC~~: NEGATIVE (ref ?–300)
MDMA (Ecstasy)Ur Screen: NEGATIVE (ref ?–500)
Opiate, Ur Screen: NEGATIVE (ref ?–300)
Phencyclidine (PCP) Ur S: NEGATIVE (ref ?–25)
Tricyclic, Ur Screen: POSITIVE (ref ?–1000)

## 2012-05-22 LAB — URINALYSIS, COMPLETE
Bilirubin,UR: NEGATIVE
Glucose,UR: NEGATIVE mg/dL (ref 0–75)
Hyaline Cast: 3
Ketone: NEGATIVE
Nitrite: NEGATIVE
Ph: 5 (ref 4.5–8.0)
Protein: NEGATIVE
RBC,UR: 1 /HPF (ref 0–5)
Specific Gravity: 1.016 (ref 1.003–1.030)
Squamous Epithelial: NONE SEEN
WBC UR: 1 /HPF (ref 0–5)

## 2012-05-22 LAB — CBC
HCT: 25.6 % — ABNORMAL LOW (ref 40.0–52.0)
HGB: 8.9 g/dL — ABNORMAL LOW (ref 13.0–18.0)
MCHC: 34.9 g/dL (ref 32.0–36.0)
MCV: 99 fL (ref 80–100)
Platelet: 142 10*3/uL — ABNORMAL LOW (ref 150–440)
RBC: 2.59 10*6/uL — ABNORMAL LOW (ref 4.40–5.90)
WBC: 5.1 10*3/uL (ref 3.8–10.6)

## 2012-05-22 LAB — COMPREHENSIVE METABOLIC PANEL
Albumin: 3.6 g/dL (ref 3.4–5.0)
Alkaline Phosphatase: 58 U/L (ref 50–136)
Anion Gap: 10 (ref 7–16)
BUN: 37 mg/dL — ABNORMAL HIGH (ref 7–18)
Chloride: 105 mmol/L (ref 98–107)
Creatinine: 1.8 mg/dL — ABNORMAL HIGH (ref 0.60–1.30)
EGFR (African American): 42 — ABNORMAL LOW
EGFR (Non-African Amer.): 37 — ABNORMAL LOW
Glucose: 80 mg/dL (ref 65–99)
Osmolality: 285 (ref 275–301)
SGPT (ALT): 32 U/L (ref 12–78)
Sodium: 139 mmol/L (ref 136–145)

## 2012-05-22 LAB — TROPONIN I
Troponin-I: <0.02 ng/mL
Troponin-I: <0.02 ng/mL

## 2012-05-22 LAB — CK TOTAL AND CKMB (NOT AT ARMC)
CK, Total: 200 U/L (ref 35–232)
CK, Total: 168 U/L (ref 35–232)
CK-MB: 3 ng/mL (ref 0.5–3.6)

## 2012-05-22 LAB — FERRITIN: Ferritin (ARMC): 83 ng/mL (ref 8–388)

## 2012-05-22 LAB — IRON AND TIBC
Iron Bind.Cap.(Total): 266 ug/dL (ref 250–450)
Iron: 64 ug/dL — ABNORMAL LOW (ref 65–175)

## 2012-05-23 LAB — BASIC METABOLIC PANEL
BUN: 32 mg/dL — ABNORMAL HIGH (ref 7–18)
Calcium, Total: 8.4 mg/dL — ABNORMAL LOW (ref 8.5–10.1)
Creatinine: 1.38 mg/dL — ABNORMAL HIGH (ref 0.60–1.30)
EGFR (African American): 58 — ABNORMAL LOW
EGFR (Non-African Amer.): 50 — ABNORMAL LOW
Potassium: 4.6 mmol/L (ref 3.5–5.1)
Sodium: 140 mmol/L (ref 136–145)

## 2012-05-23 LAB — TROPONIN I: Troponin-I: <0.02 ng/mL

## 2012-05-23 LAB — CK TOTAL AND CKMB (NOT AT ARMC): CK, Total: 142 U/L (ref 35–232)

## 2012-05-24 LAB — CBC WITH DIFFERENTIAL/PLATELET
Basophil #: 0 10*3/uL (ref 0.0–0.1)
Eosinophil #: 0.1 10*3/uL (ref 0.0–0.7)
Eosinophil %: 1.9 %
HCT: 26.3 % — ABNORMAL LOW (ref 40.0–52.0)
HGB: 9 g/dL — ABNORMAL LOW (ref 13.0–18.0)
Lymphocyte %: 23.9 %
MCH: 34.3 pg — ABNORMAL HIGH (ref 26.0–34.0)
MCHC: 34.4 g/dL (ref 32.0–36.0)
Monocyte %: 12.2 %
Neutrophil #: 3.5 10*3/uL (ref 1.4–6.5)
Neutrophil %: 61.8 %
Platelet: 139 10*3/uL — ABNORMAL LOW (ref 150–440)
RBC: 2.63 10*6/uL — ABNORMAL LOW (ref 4.40–5.90)
RDW: 13.6 % (ref 11.5–14.5)

## 2012-05-24 LAB — RETICULOCYTES: Absolute Retic Count: 0.125 10*6/uL (ref 0.031–0.129)

## 2012-05-24 LAB — HEMOGLOBIN: HGB: 9.7 g/dL — ABNORMAL LOW (ref 13.0–18.0)

## 2012-05-24 LAB — IRON AND TIBC
Iron Bind.Cap.(Total): 281 ug/dL (ref 250–450)
Iron Saturation: 25 %
Iron: 71 ug/dL (ref 65–175)
Unbound Iron-Bind.Cap.: 210 ug/dL

## 2012-05-24 LAB — FERRITIN: Ferritin (ARMC): 73 ng/mL (ref 8–388)

## 2012-05-25 LAB — CBC WITH DIFFERENTIAL/PLATELET
Basophil %: 0.6 %
Eosinophil #: 0.1 10*3/uL (ref 0.0–0.7)
HCT: 27.6 % — ABNORMAL LOW (ref 40.0–52.0)
HGB: 9.7 g/dL — ABNORMAL LOW (ref 13.0–18.0)
Lymphocyte #: 1.3 10*3/uL (ref 1.0–3.6)
Lymphocyte %: 21.1 %
MCH: 34.8 pg — ABNORMAL HIGH (ref 26.0–34.0)
MCHC: 35.3 g/dL (ref 32.0–36.0)
MCV: 99 fL (ref 80–100)
Monocyte #: 0.7 x10 3/mm (ref 0.2–1.0)
Neutrophil #: 3.9 10*3/uL (ref 1.4–6.5)
Neutrophil %: 64.8 %

## 2012-05-28 ENCOUNTER — Ambulatory Visit (INDEPENDENT_AMBULATORY_CARE_PROVIDER_SITE_OTHER): Payer: Medicare Other | Admitting: Internal Medicine

## 2012-05-28 ENCOUNTER — Encounter: Payer: Self-pay | Admitting: Internal Medicine

## 2012-05-28 VITALS — BP 128/60 | HR 70 | Temp 98.1°F | Ht 71.0 in | Wt 247.0 lb

## 2012-05-28 DIAGNOSIS — E162 Hypoglycemia, unspecified: Secondary | ICD-10-CM | POA: Insufficient documentation

## 2012-05-28 DIAGNOSIS — D649 Anemia, unspecified: Secondary | ICD-10-CM
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
## 2012-05-29 ENCOUNTER — Ambulatory Visit (INDEPENDENT_AMBULATORY_CARE_PROVIDER_SITE_OTHER): Payer: Medicare Other | Admitting: Cardiology

## 2012-05-29 ENCOUNTER — Encounter: Payer: Self-pay | Admitting: Cardiology

## 2012-05-29 VITALS — BP 140/63 | HR 65 | Ht 71.0 in | Wt 249.2 lb

## 2012-05-29 DIAGNOSIS — E7800 Pure hypercholesterolemia, unspecified: Secondary | ICD-10-CM

## 2012-05-29 DIAGNOSIS — F17200 Nicotine dependence, unspecified, uncomplicated: Secondary | ICD-10-CM

## 2012-05-29 DIAGNOSIS — I359 Nonrheumatic aortic valve disorder, unspecified: Secondary | ICD-10-CM

## 2012-05-29 DIAGNOSIS — I2510 Atherosclerotic heart disease of native coronary artery without angina pectoris: Secondary | ICD-10-CM

## 2012-05-29 DIAGNOSIS — E669 Obesity, unspecified: Secondary | ICD-10-CM
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
## 2012-05-29 NOTE — Patient Instructions (Addendum)
The current medical regimen is effective;  continue present plan and medications.  Please return in 2 month to have cholesterol checked.  Do not eat or drink after midnight.  Your physician has requested that you have an echocardiogram. Echocardiography is a painless test that uses sound waves to create images of your heart. It provides your doctor with information about the size and shape of your heart and how well your heart's chambers and valves are working. This procedure takes approximately one hour. There are no restrictions for this procedure.  Follow up with Dr Antoine Poche in 2 months.

## 2012-06-02 ENCOUNTER — Other Ambulatory Visit: Payer: Self-pay | Admitting: *Deleted

## 2012-06-02 DIAGNOSIS — Z952 Presence of prosthetic heart valve: Secondary | ICD-10-CM

## 2012-06-09 ENCOUNTER — Other Ambulatory Visit: Payer: Self-pay

## 2012-06-09 ENCOUNTER — Other Ambulatory Visit (INDEPENDENT_AMBULATORY_CARE_PROVIDER_SITE_OTHER): Payer: Medicare Other

## 2012-06-09 DIAGNOSIS — I359 Nonrheumatic aortic valve disorder, unspecified: Secondary | ICD-10-CM

## 2012-06-09 DIAGNOSIS — I2510 Atherosclerotic heart disease of native coronary artery without angina pectoris: Secondary | ICD-10-CM

## 2012-06-09 DIAGNOSIS — Z952 Presence of prosthetic heart valve: Secondary | ICD-10-CM

## 2012-07-10 ENCOUNTER — Ambulatory Visit: Payer: Medicare Other | Admitting: Cardiology

## 2012-07-29 ENCOUNTER — Ambulatory Visit (INDEPENDENT_AMBULATORY_CARE_PROVIDER_SITE_OTHER): Payer: Medicare Other

## 2012-07-29 ENCOUNTER — Ambulatory Visit: Payer: Medicare Other

## 2012-07-29 DIAGNOSIS — E7800 Pure hypercholesterolemia, unspecified: Secondary | ICD-10-CM

## 2012-07-30 LAB — LIPID PANEL
Chol/HDL Ratio: 5.1 ratio units — ABNORMAL HIGH (ref 0.0–5.0)
HDL: 30 mg/dL — ABNORMAL LOW (ref >39)

## 2012-08-05 ENCOUNTER — Encounter: Payer: Self-pay | Admitting: Cardiology

## 2012-08-05 ENCOUNTER — Ambulatory Visit (INDEPENDENT_AMBULATORY_CARE_PROVIDER_SITE_OTHER): Payer: Medicare Other | Admitting: Cardiology

## 2012-08-05 VITALS — BP 155/68 | HR 69 | Ht 72.0 in | Wt 251.0 lb

## 2012-08-05 DIAGNOSIS — I2510 Atherosclerotic heart disease of native coronary artery without angina pectoris: Secondary | ICD-10-CM

## 2012-08-05 DIAGNOSIS — I6529 Occlusion and stenosis of unspecified carotid artery: Secondary | ICD-10-CM

## 2012-08-05 DIAGNOSIS — I10 Essential (primary) hypertension: Secondary | ICD-10-CM

## 2012-08-05 DIAGNOSIS — I359 Nonrheumatic aortic valve disorder, unspecified: Secondary | ICD-10-CM

## 2012-08-05 MED ORDER — AMLODIPINE BESYLATE 5 MG PO TABS: 5.0000 mg | ORAL_TABLET | Freq: Every day | ORAL | Status: DC

## 2012-08-05 MED ORDER — HYDRALAZINE HCL 50 MG PO TABS: 50.0000 mg | ORAL_TABLET | Freq: Three times a day (TID) | ORAL | Status: DC
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
## 2012-08-05 NOTE — Patient Instructions (Addendum)
Increase Hydralazine to 50 mg three times a day Continue all other medications as listed.  Follow up in 6 months with Dr Antoine Poche.  You will receive a letter in the mail 2 months before you are due.  Please call us when you receive this letter to schedule your follow up appointment.

## 2012-09-11 ENCOUNTER — Emergency Department: Payer: Self-pay | Admitting: Emergency Medicine

## 2012-09-11 LAB — CBC
HCT: 41.8 % (ref 40.0–52.0)
HGB: 13.6 g/dL (ref 13.0–18.0)
MCHC: 32.5 g/dL (ref 32.0–36.0)
MCV: 93 fL (ref 80–100)
Platelet: 178 10*3/uL (ref 150–440)
RBC: 4.48 10*6/uL (ref 4.40–5.90)
WBC: 11.1 10*3/uL — ABNORMAL HIGH (ref 3.8–10.6)

## 2012-09-11 LAB — BASIC METABOLIC PANEL
Anion Gap: 12 (ref 7–16)
BUN: 29 mg/dL — ABNORMAL HIGH (ref 7–18)
Calcium, Total: 8.8 mg/dL (ref 8.5–10.1)
Creatinine: 1.5 mg/dL — ABNORMAL HIGH (ref 0.60–1.30)
EGFR (African American): 53 — ABNORMAL LOW
Glucose: 317 mg/dL — ABNORMAL HIGH (ref 65–99)
Sodium: 136 mmol/L (ref 136–145)

## 2012-09-12 LAB — TROPONIN I: Troponin-I: 0.1 ng/mL — ABNORMAL HIGH

## 2012-09-12 LAB — CK TOTAL AND CKMB (NOT AT ARMC): CK, Total: 447 U/L — ABNORMAL HIGH (ref 35–232)

## 2013-02-03 ENCOUNTER — Ambulatory Visit (INDEPENDENT_AMBULATORY_CARE_PROVIDER_SITE_OTHER): Payer: Self-pay | Admitting: Cardiology

## 2013-02-03 ENCOUNTER — Encounter: Payer: Self-pay | Admitting: Cardiology

## 2013-02-03 VITALS — BP 135/59 | HR 62 | Ht 72.0 in | Wt 255.8 lb

## 2013-02-03 DIAGNOSIS — I10 Essential (primary) hypertension: Secondary | ICD-10-CM

## 2013-02-03 DIAGNOSIS — I2510 Atherosclerotic heart disease of native coronary artery without angina pectoris: Secondary | ICD-10-CM

## 2013-02-03 DIAGNOSIS — I6523 Occlusion and stenosis of bilateral carotid arteries: Secondary | ICD-10-CM

## 2013-02-03 DIAGNOSIS — I658 Occlusion and stenosis of other precerebral arteries: Secondary | ICD-10-CM

## 2013-02-03 DIAGNOSIS — F17200 Nicotine dependence, unspecified, uncomplicated: Secondary | ICD-10-CM

## 2013-02-03 DIAGNOSIS — I6529 Occlusion and stenosis of unspecified carotid artery: Secondary | ICD-10-CM

## 2013-02-03 DIAGNOSIS — I359 Nonrheumatic aortic valve disorder, unspecified: Secondary | ICD-10-CM

## 2013-02-03 NOTE — Patient Instructions (Addendum)
The current medical regimen is effective;  continue present plan and medications.  Your physician has requested that you have a carotid duplex. This test is an ultrasound of the carotid arteries in your neck. It looks at blood flow through these arteries that supply the brain with blood. Allow one hour for this exam. There are no restrictions or special instructions.  Follow up in 6 months with Dr Hochrein.  You will receive a letter in the mail 2 months before you are due.  Please call us when you receive this letter to schedule your follow up appointment.  

## 2013-02-03 NOTE — Progress Notes (Signed)
HPI The patient presents for followup of CAD. Since I last saw him he has done relatively well. He denies any new cardiovascular symptoms. He is not having any chest pressure, neck or arm discomfort. He is having no palpitations, presyncope or syncope. He's not having any new shortness of breath, PND or orthopnea. He does have apparently a staple or 2 in in his foot. He is not sure how his care but apparently they're going to remove.  Allergies  Allergen Reactions  . Clopidogrel Bisulfate   . Venlafaxine Other (See Comments)    Generalized aches and pains  . Tramadol Rash    Current Outpatient Prescriptions  Medication Sig Dispense Refill  . acetaminophen (TYLENOL) 325 MG tablet Take 975 mg by mouth 3 (three) times daily.      Marland Kitchen amLODipine (NORVASC) 10 MG tablet Take 10 mg by mouth daily.      Marland Kitchen ammonium lactate (AMLACTIN) 12 % cream Apply topically as needed.      Marland Kitchen ascorbic acid (VITAMIN C) 500 MG tablet Take 500 mg by mouth daily.      Marland Kitchen aspirin 81 MG tablet Take 81 mg by mouth daily.      Marland Kitchen atorvastatin (LIPITOR) 20 MG tablet Take 10 mg by mouth daily.      . bacitracin 500 UNIT/GM ointment Apply 1 application topically 2 (two) times daily.      . carvedilol (COREG) 25 MG tablet Take 25 mg by mouth 2 (two) times daily with a meal.      . citalopram (CELEXA) 40 MG tablet Take 40 mg by mouth daily.      . clonazePAM (KLONOPIN) 1 MG tablet Take 0.5 mg by mouth at bedtime.      . docusate sodium (COLACE) 100 MG capsule Take 100 mg by mouth 2 (two) times daily.      Marland Kitchen doxycycline (VIBRAMYCIN) 50 MG capsule Take 50 mg by mouth daily.        Marland Kitchen eplerenone (INSPRA) 25 MG tablet Take 25 mg by mouth daily.      . ferrous sulfate 325 (65 FE) MG tablet Take 325 mg by mouth daily with breakfast.        . glucose blood test strip 1 each by Other route 2 (two) times daily. Use as instructed       . HYDRALAZINE HCL PO Take 75 mg by mouth 3 (three) times daily.      . hydrochlorothiazide  (HYDRODIURIL) 25 MG tablet Take 25 mg by mouth daily.      . hydroxypropyl methylcellulose (ISOPTO TEARS) 2.5 % ophthalmic solution Place 1 drop into both eyes 4 (four) times daily as needed.      . INSULIN ASPART [place] Inject 15 Units into the skin 3 (three) times daily with meals.      . insulin glargine (LANTUS SOLOSTAR) 100 UNIT/ML injection Inject 35 Units into the skin Nightly.       . insulin regular (NOVOLIN R,HUMULIN R) 100 units/mL injection Inject 18 Units into the skin 3 (three) times daily before meals.       . isosorbide dinitrate (ISORDIL) 20 MG tablet Take 20 mg by mouth 3 (three) times daily.       . Multiple Vitamins-Minerals (CENTRUM SILVER PO) Take by mouth daily.        . nitroGLYCERIN (NITROSTAT) 0.4 MG SL tablet Place 0.4 mg under the tongue every 5 (five) minutes as needed.      . nortriptyline (PAMELOR)  25 MG capsule Take 25 mg by mouth 2 (two) times daily.        Marland Kitchen omeprazole (PRILOSEC) 20 MG capsule Take 20 mg by mouth 2 (two) times daily.        Marland Kitchen oxybutynin (DITROPAN-XL) 10 MG 24 hr tablet Take 20 mg by mouth daily.       . pramipexole (MIRAPEX) 0.125 MG tablet Take 0.125 mg by mouth at bedtime.       . prazosin (MINIPRESS) 5 MG capsule 5 mg at bedtime.       . selenium sulfide (SELSUN) 2.5 % shampoo Apply 1 application topically daily as needed.      . sodium fluoride (FLUORISHIELD) 1.1 % GEL dental gel Place 1 application onto teeth at bedtime.      . terbinafine (LAMISIL) 1 % cream Apply 1 application topically 2 (two) times daily.       No current facility-administered medications for this visit.    Past Medical History  Diagnosis Date  . Hx of colonic polyps   . Coronary artery disease     left main was normal, LAD had 50-60% stenosis in the mid vessel and 60-70% stenosis in the distal vessel. The first diagonal had ostial 95% stenosis as it emerged from the stented area. Circumflex at 40% stenosis in the mid circumflex. The right coronary artery had PDA with  90% stenosis, which has progressed slightly form previous.  . Diverticulosis of colon   . GERD (gastroesophageal reflux disease)   . Hyperlipidemia   . Hypertension   . Osteoarthritis     hands/knees  . Hypertrophy (benign) of prostate     benign prostatic   . Rosacea   . Restless legs syndrome   . Sleep apnea   . PTSD (post-traumatic stress disorder)   . NIDDM (non-insulin dependent diabetes mellitus)   . Carotid artery stenosis     Past Surgical History  Procedure Laterality Date  . Knee surgery  1981    on right knee  . Coronary stent placement  8/12    at [facility]    ROS:  Neuropathy  Otherwise as stated in the HPI and negative for all other systems.  PHYSICAL EXAM BP 135/59  Pulse 62  Ht 6' (1.829 m)  Wt 255 lb 12.8 oz (116.03 kg)  BMI 34.69 kg/m2 GENERAL:  Well appearing HEENT:  Pupils equal round and reactive, fundi not visualized, oral mucosa unremarkable, edentulous NECK:  No jugular venous distention, waveform within normal limits, carotid upstroke brisk and symmetric, bilateral carotid bruits, no thyromegaly LUNGS:  Clear to auscultation bilaterally BACK:  No CVA tenderness HEART:  PMI not displaced or sustained,S1 and S2 within normal limits, no S3, no S4, no clicks, no rubs, 3/6 early peaking systolic murmur ABD:  Flat, positive bowel sounds normal in frequency in pitch, no bruits, no rebound, no guarding, no midline pulsatile mass, no hepatomegaly, no splenomegaly, obese EXT:  2 plus pulses upper, absent DP/PT bilateral, no edema, no cyanosis no clubbing  EKG:  Sinus rhythm, rate 62, axis within normal limits, intervals within normal limits, no acute ST-T wave changes.  ASSESSMENT AND PLAN    CORONARY ARTERY DISEASE - Given his negative stress perfusion study recently no further cardiovascular testing is suggested. We will continue aggressive risk reduction.  HYPERTENSION - The blood pressure is at target. No change in medications is indicated. We will  continue with therapeutic lifestyle changes (TLC).  OBESITY, UNSPECIFIED - we had a long discussion again  today about diet and exercise  HYPERLIPIDEMIA - His last calculated LDL was 60. Because of polypharmacy are not planning to increase his Lipitor to a higher dose although current guidelines would suggest 40-80 mg.  Aortic valve disorders - He had mild aortic stenosis on echo in November. No further imaging is indicated at this point.  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

## 2013-02-27 ENCOUNTER — Encounter: Payer: Self-pay | Admitting: Gastroenterology

## 2013-03-04 ENCOUNTER — Encounter (INDEPENDENT_AMBULATORY_CARE_PROVIDER_SITE_OTHER): Payer: Medicare Other

## 2013-03-04 DIAGNOSIS — I6523 Occlusion and stenosis of bilateral carotid arteries: Secondary | ICD-10-CM

## 2013-03-04 DIAGNOSIS — I6529 Occlusion and stenosis of unspecified carotid artery: Secondary | ICD-10-CM

## 2013-03-13 ENCOUNTER — Encounter: Payer: Self-pay | Admitting: Gastroenterology

## 2013-05-13 ENCOUNTER — Encounter: Payer: Self-pay | Admitting: Gastroenterology

## 2013-05-13 ENCOUNTER — Ambulatory Visit (AMBULATORY_SURGERY_CENTER): Payer: Medicare Other

## 2013-05-13 VITALS — Ht 72.0 in | Wt 250.0 lb

## 2013-05-13 DIAGNOSIS — Z860100 Personal history of colon polyps, unspecified: Secondary | ICD-10-CM

## 2013-05-13 MED ORDER — SUPREP BOWEL PREP KIT 17.5-3.13-1.6 GM/177ML PO SOLN: 1.0000 | Freq: Once | ORAL | Status: DC

## 2013-05-26 ENCOUNTER — Encounter: Payer: Medicare Other | Admitting: Gastroenterology

## 2013-08-28 ENCOUNTER — Encounter (INDEPENDENT_AMBULATORY_CARE_PROVIDER_SITE_OTHER): Payer: Self-pay

## 2013-08-28 ENCOUNTER — Ambulatory Visit (INDEPENDENT_AMBULATORY_CARE_PROVIDER_SITE_OTHER): Payer: Medicare Other | Admitting: Cardiology

## 2013-08-28 ENCOUNTER — Encounter: Payer: Self-pay | Admitting: Cardiology

## 2013-08-28 VITALS — BP 140/60 | HR 65 | Ht 72.0 in | Wt 260.0 lb

## 2013-08-28 DIAGNOSIS — I359 Nonrheumatic aortic valve disorder, unspecified: Secondary | ICD-10-CM

## 2013-08-28 DIAGNOSIS — I2510 Atherosclerotic heart disease of native coronary artery without angina pectoris: Secondary | ICD-10-CM

## 2013-08-28 DIAGNOSIS — I10 Essential (primary) hypertension: Secondary | ICD-10-CM

## 2013-08-28 NOTE — Patient Instructions (Signed)
Your physician recommends that you continue on your current medications as directed. Please refer to the Current Medication list given to you today.  Your physician wants you to follow-up in: 1 year ov unless you need us before then. You will receive a reminder letter in the mail two months in advance. If you don't receive a letter, please call our office to schedule the follow-up appointment.

## 2013-08-28 NOTE — Progress Notes (Signed)
HPI The patient presents for followup of CAD. Since I last saw him he has done relatively well. He denies any new cardiovascular symptoms. Is not exercising as he has some knee pain. He is considering having knee surgery. He did have a staple embedded in his foot and it was decided not to have this removed. He has a neuropathy. With his activity he denies any new cardiovascular symptoms. The patient denies any new symptoms such as chest discomfort, neck or arm discomfort. There has been no new shortness of breath, PND or orthopnea. There have been no reported palpitations, presyncope or syncope.  Allergies  Allergen Reactions  . Venlafaxine Other (See Comments)    Generalized aches and pains  . Clopidogrel Bisulfate Rash  . Tramadol Rash    Current Outpatient Prescriptions  Medication Sig Dispense Refill  . acetaminophen (TYLENOL) 325 MG tablet Take 650 mg by mouth every 6 (six) hours as needed.       Marland Kitchen amLODipine (NORVASC) 10 MG tablet Take 10 mg by mouth daily.      Marland Kitchen ammonium lactate (AMLACTIN) 12 % cream Apply topically as needed.      Marland Kitchen ascorbic acid (VITAMIN C) 500 MG tablet Take 500 mg by mouth daily.      Marland Kitchen aspirin 81 MG tablet Take 81 mg by mouth daily.      Marland Kitchen atorvastatin (LIPITOR) 20 MG tablet Take 10 mg by mouth daily.      . bacitracin 500 UNIT/GM ointment Apply 1 application topically 2 (two) times daily.      . carvedilol (COREG) 25 MG tablet Take 25 mg by mouth 2 (two) times daily with a meal.      . citalopram (CELEXA) 40 MG tablet Take 40 mg by mouth daily.      . clonazePAM (KLONOPIN) 1 MG tablet Take 0.5 mg by mouth at bedtime.      . docusate sodium (COLACE) 100 MG capsule Take 100 mg by mouth 2 (two) times daily.      Marland Kitchen eplerenone (INSPRA) 25 MG tablet Take 25 mg by mouth daily.      . ferrous sulfate 325 (65 FE) MG tablet Take 325 mg by mouth 3 (three) times daily with meals.       Marland Kitchen glucose blood test strip 1 each by Other route 2 (two) times daily. Use as  instructed       . HYDRALAZINE HCL PO Take 75 mg by mouth 3 (three) times daily.      . hydrochlorothiazide (HYDRODIURIL) 25 MG tablet Take 25 mg by mouth daily.      . hydroxypropyl methylcellulose (ISOPTO TEARS) 2.5 % ophthalmic solution Place 1 drop into both eyes 4 (four) times daily as needed.      . insulin glargine (LANTUS SOLOSTAR) 100 UNIT/ML injection Inject 35 Units into the skin Nightly.       . insulin regular (NOVOLIN R,HUMULIN R) 100 units/mL injection Inject 18 Units into the skin 3 (three) times daily before meals.       . isosorbide mononitrate (IMDUR) 120 MG 24 hr tablet Take 120 mg by mouth daily.      . Multiple Vitamins-Minerals (CENTRUM SILVER PO) Take by mouth daily.        . nitroGLYCERIN (NITROSTAT) 0.4 MG SL tablet Place 0.4 mg under the tongue every 5 (five) minutes as needed.      . nortriptyline (PAMELOR) 25 MG capsule Take 25 mg by mouth 2 (two) times daily.        Marland Kitchen  Omega-3 Fatty Acids (FISH OIL) 1000 MG CAPS Take 1,000 mg by mouth.      Marland Kitchen. omeprazole (PRILOSEC) 20 MG capsule Take 20 mg by mouth 2 (two) times daily.        . pramipexole (MIRAPEX) 0.125 MG tablet Take 0.125 mg by mouth at bedtime.       . prazosin (MINIPRESS) 5 MG capsule 5 mg at bedtime.       . sodium fluoride (FLUORISHIELD) 1.1 % GEL dental gel Place 1 application onto teeth at bedtime.      . terbinafine (LAMISIL) 1 % cream Apply 1 application topically 2 (two) times daily.      Marland Kitchen. oxybutynin (DITROPAN-XL) 10 MG 24 hr tablet Take 20 mg by mouth daily.       Marland Kitchen. selenium sulfide (SELSUN) 2.5 % shampoo Apply 1 application topically daily as needed.       No current facility-administered medications for this visit.    Past Medical History  Diagnosis Date  . Hx of colonic polyps   . Coronary artery disease     left main was normal, LAD had 50-60% stenosis in the mid vessel and 60-70% stenosis in the distal vessel. The first diagonal had ostial 95% stenosis as it emerged from the stented area.  Circumflex at 40% stenosis in the mid circumflex. The right coronary artery had PDA with 90% stenosis, which has progressed slightly form previous.  . Diverticulosis of colon   . GERD (gastroesophageal reflux disease)   . Hyperlipidemia   . Hypertension   . Osteoarthritis     hands/knees  . Hypertrophy (benign) of prostate     benign prostatic   . Rosacea   . Restless legs syndrome   . Sleep apnea   . PTSD (post-traumatic stress disorder)   . NIDDM (non-insulin dependent diabetes mellitus)   . Carotid artery stenosis     Past Surgical History  Procedure Laterality Date  . Knee surgery  1981    on right knee  . [facility]    ROS:  Neuropathy  Otherwise as stated in the HPI and negative for all other systems.  PHYSICAL EXAM BP 140/60  Pulse 65  Ht 6' (1.829 m)  Wt 260 lb (117.935 kg)  BMI 35.25 kg/m2 GENERAL:  Well appearing HEENT:  Pupils equal round and reactive, fundi not visualized, oral mucosa unremarkable, edentulous NECK:  No jugular venous distention, waveform within normal limits, carotid upstroke brisk and symmetric, bilateral carotid bruits, no thyromegaly LUNGS:  Clear to auscultation bilaterally BACK:  No CVA tenderness HEART:  PMI not displaced or sustained,S1 and S2 within normal limits, no S3, no S4, no clicks, no rubs, 3/6 early peaking systolic murmur ABD:  Flat, positive bowel sounds normal in frequency in pitch, no bruits, no rebound, no guarding, no midline pulsatile mass, no hepatomegaly, no splenomegaly, obese EXT:  2 plus pulses upper, absent DP/PT bilateral, no edema, no cyanosis no clubbing  EKG:  Sinus rhythm, rate 65, axis within normal limits, intervals within normal limits, no acute ST-T wave changes.  08/28/2013  ASSESSMENT AND PLAN    CORONARY ARTERY DISEASE - Given his negative stress perfusion study in 2013.  No further cardiovascular testing is suggested. We will continue aggressive risk  reduction.  HYPERTENSION - The blood pressure is at target. No change in medications is indicated. We will continue with therapeutic lifestyle changes (TLC).  OBESITY, UNSPECIFIED -  We continue to discuss  this at every appointment. He may be getting his knees fixed with myself as walking which might help with weight.  HYPERLIPIDEMIA -  He says that he had this checked at the [place] but I don't have these records and I would be very interested in seeing his cholesterol if he can get this to me. For now he will continue on the meds as listed.  Aortic valve disorders - He had mild aortic stenosis on echo in November 2013. No further imaging is indicated at this point.  I will likely follow this up when I see him next time.  Carotid stenosis - He has 40-59% right stenosis and 1-39% left stenosis and is due to have followup in September.

## 2013-10-13 ENCOUNTER — Encounter: Payer: Self-pay | Admitting: Gastroenterology

## 2013-11-06 ENCOUNTER — Telehealth: Payer: Self-pay | Admitting: Gastroenterology

## 2013-11-06 NOTE — Telephone Encounter (Signed)
Received our Recall Colon Letter in the mail. [place] had procedure done @ [facility]

## 2013-12-04 ENCOUNTER — Telehealth: Payer: Self-pay | Admitting: Cardiology

## 2013-12-04 NOTE — Telephone Encounter (Signed)
Will forward to Dr Hochrein for his information 

## 2013-12-04 NOTE — Telephone Encounter (Signed)
New message    Patient past away on  5/10 .

## 2013-12-07 NOTE — Telephone Encounter (Signed)
Spoke with wife - states pt had been having some abdominal pain and thought maybe he was having trouble with his gallbladder.  Went to ED for evaluation and was DX with liver cancer.  That was 10/29/2013.  He was in the hospital for 9 days then went home.  They decided [facility].  The pt was taken there but died shortly after arrival.  They were married for 49 yrs.  [facility].

## 2013-12-21 DEATH — deceased

## 2014-04-09 ENCOUNTER — Telehealth: Payer: Self-pay | Admitting: *Deleted

## 2014-04-09 NOTE — Telephone Encounter (Signed)
Lmom to call our office. Time to schedule a Carotid Doppler u/s (1 yr f/u).
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
## 2014-11-09 NOTE — Consult Note (Signed)
Brief Consult Note: Diagnosis: Acute on chronic iron deficiency anemia.   Patient was seen by consultant.   Consult note dictated.   Comments: No gi complaints. Reports most recent colonoscopy about 1-1/2 yrs ago. He has had about 3 colonoscopies and reports hx of benign polyps. He has had EGD x 2-date of last procedure unkown, but results negative. [facility] [place] [facility] not available at this time. He denies PUD, heartburn is well managed on chronic omeprazole. He has chronic IDA and takes iron which makes stool black all of the time. Some constipation.  Plan: Hemoccult stool to check for  gi blood loss. Dr. Mechele CollinElliott reviewed case and also recommends a holter monitor.  Further gi recommnedations pending.  Electronic Signatures: Rowan BlaseMills, Georgann Bramble Ann (NP)  (Signed [phone]-Nov-13 13:16)  Authored: Brief Consult Note   Last Updated: 01-Nov-13 13:16 by Rowan BlaseMills, Tiberius Loftus Ann (NP)

## 2014-11-09 NOTE — Consult Note (Signed)
Brief Consult Note: Diagnosis: IDA, heme positive light brown stool.   Comments: DRE by me with heme positive stool . PLAN: Treat him for silent ulcer with PPI bid. Other etiology to consider diverticular bleed. He reports a colonoscopy < 2 yrs ago. . Case d/w Dr. Mechele CollinElliott in collaboration of care..  Electronic Signatures: Rowan BlaseMills, Andalyn Heckstall Ann (NP)  (Signed [phone]-Nov-13 17:24)  Authored: Brief Consult Note   Last Updated: 01-Nov-13 17:24 by Rowan BlaseMills, Jazmon Kos Ann (NP)

## 2014-11-09 NOTE — Discharge Summary (Signed)
PATIENT NAME:  [name] Ball, [name] Ball MR#:  [id number] DATE OF BIRTH:  [birth year]  DATE OF ADMISSION:  05/20/2012 DATE OF DISCHARGE:  05/21/2012  PRIMARY CARE PHYSICIAN: Dr. Alphonsus SiasLetvak   DISCHARGE DIAGNOSES:  1. Atypical chest pain. 2. Hypertension. 3. Coronary artery disease. 4. Diabetes.   CONDITION: Stable.   HOME MEDICATIONS: Please refer to the medication reconciliation list. Continue all home medications. No new medications at this time.   DIET: Low sodium, ADA diet.   ACTIVITY: As tolerated.   FOLLOW-UP CARE: Follow up with PCP within 1 to 2 weeks.   REASON FOR ADMISSION: Chest pain.   HOSPITAL COURSE: Patient is a 74 year old Caucasian male with history of coronary artery disease status post stent, diabetes, hypertension, anxiety, depression, hyperlipidemia presented to ED with chest pain for one day. For detailed history and physical examination please refer to the admission note dictated by Dr. Amado CoeGouru. On admission date patient's troponin was less than 0.02, BUN 31, creatinine 1.61 which is his baseline. Urinalysis is negative. EKG with normal rhythm, no ST-T wave changes. After admission patient has been placed for ACS protocol, treated with oxygen, nitroglycerin, aspirin, beta blocker and statin. Patient's troponin has been negative. Patient got a stress test yesterday which is normal. Echocardiogram normal ejection fraction. Patient's blood pressure was elevated above 180 yesterday, has been treated with hypertension medication. Patient has no chest pain today. His vital signs are stable. He will be discharged to home today.   Discussed the patient's discharge plan with patient and case manager.   TIME SPENT: About 32 minutes.  ____________________________ Shaune PollackQing Octavion Mollenkopf, MD qc:cms D: 05/21/2012 13:34:21 ET T: 05/21/2012 13:48:51 ET JOB#: [id number]  cc: Shaune PollackQing Christabella Alvira, MD, <Dictator> Karie Schwalbeichard I. Letvak, MD  Shaune PollackQING Iban Utz MD ELECTRONICALLY SIGNED 05/21/2012 14:22

## 2014-11-09 NOTE — Discharge Summary (Signed)
PATIENT NAME:  Ball, [name] MR#:  [id number] DATE OF BIRTH:  1940/10/31  DATE OF ADMISSION:  05/22/2012 DATE OF DISCHARGE:  05/25/2012  ADMITTING DIAGNOSIS:  Presyncope.  DISCHARGE DIAGNOSES:   1. Syncope likely due to  bradycardia as well as orthostatic hypotension and possibly diabetic neuropathy related as well dehydration related.  2. Dehydration.  3. Acute on chronic renal failure, resolved on intravenous fluids.  4. Anemia of chronic disease. 5. Diabetes mellitus type 2, insulin-dependent with hyperglycemia episodes which could have contributed  to presyncopal episode. 6. Generalized weakness.  7. Constipation.  8. Weight loss.  9. History of hypertension. 10. Coronary artery disease. 11. Hyperlipidemia. 12. Depression, anxiety. 13. CKD Stage III. 14. Diabetic neuropathy. 15. Gastroesophageal reflux disease.   DISCHARGE CONDITION: Stable.   DISCHARGE MEDICATIONS: The patient is to resume his outpatient medications which are:  1. Amlodipine 10 mg p.o. daily.  2. Citalopram 40 mg p.o. daily.  3. Aspirin 81 mg p.o. daily.  4. Lorazepam 1 mg p.o. at bedtime.  5. Clonidine 0.1 mg tablet 3 tablets twice daily. The patient was advised to be cautious about clonidine due to concerns of exacerbating orthostatic hypotension.  6. Iron sulfate, new medication, 325 mg p.o. twice daily.  7. Hydrochlorothiazide/lisinopril 12.5 milligrams/20 mg 2 tablets once daily.  8. Simvastatin 10 mg p.o. at bedtime.  9. Omeprazole 20 mg 2 capsules once daily.  10. Niacin 1000 mg p.o. at bedtime.  11. Pramipexole 0.125 mg 1 tablet at bedtime.  12. Nortriptyline 25 mg 2 capsules once at bedtime.  13. Acetaminophen/hydrocodone 325 mg/5 mg 1 tablet 4 times daily.  14. Insulin aspart 50 units subcutaneously 3 times daily with meals.  15. Prazosin 5 mg p.o. at bedtime. This is a new dosing. The patient is not to take prazosin at daytime.  16. Hydralazine 25 mg 3 times daily.  17. Docusate sodium 100  mg p.o. twice daily. 18. Docusate senna 50/8.6 mg 1 tablet once daily.  19. Midodrine 5 mg p.o. 3 times daily.  20. The patient was advised not to take amoxicillin clavulanate, furosemide, doxycycline, isosorbide dinitrate as well as metoprolol due to orthostatic hypotension as well as bradycardia.   HOME DIET: 2 grams salt, low fat, low cholesterol, carbohydrate controlled diet, regular consistency.   ACTIVITY LIMITATIONS: The patient was advised to continue as tolerated physical activity careful getting up from supine position. The patient was advised to take it easy and not to hurry. He is to [facility] two [facility] after discharge.   CONSULTANTS:  Dr Mechele Collin.  RADIOLOGIC STUDIES: Chest, portable single view 31st of 04/2012, revealed hyperinflation with prominence  of pulmonary vasculature as well as interstitial markings. Correlate for  mild pulmonary edema, according to the radiologist.   HISTORY OF PRESENT ILLNESS: The patient is a 74 year old Caucasian male with past medical history significant for history of recent admission to the hospital for chest pains as well as hypertension who was discharged on 05/21/2012 came back on the 31st of October 2013 with complaints of presyncopal, please refer to Dr. Eddie North admission note on the 05/22/2012. On arrival to the hospital, the patient was dizzy and he was very weak. He saw that his blood sugars were very low and they were checked and were found to be around 137. When EMS arrived, his blood pressure was around 120/70's, however, his heart rate was in the 40s  in the Emergency Room, he received IV fluids in  Emergency  Room and was admitted to the hospital because his heart rate sustaining 40s to 45. Oxygen saturation was 98% on room air and breathing was around 60; however,  blood pressure also fluctuated around 80s, and improved  with IV fluids to 130s.   PHYSICAL EXAM: Unremarkable.   LABORATORY DATA: On the day of admission  showed elevated BUN and creatinine to 37 and 1.80. The patient's iron level was 64. The patient's iron saturation was 24 and ferritin level was 83, otherwise BMP was unremarkable. The patient's liver enzymes were normal. The patient's cardiac enzymes, first set as well as subsequent two more sets, were within normal limits. Urine drug screen was positive for tricyclics antidepressants. White cell count was normal at 5.1, hemoglobin was 8.9, platelet count 142. Urinalysis was unremarkable. The patient's radiologic studies showed unremarkable chest x-ray. The patient was admitted to the hospital his beta blocker was placed on hold and patient was given IV fluids. With this therapy he somewhat improved.   1. In regards to presyncope. It was felt that the patient's presyncope was very likely related to bradycardia as well as orthostatic hypotension. The patient as mentioned above, was rehydrated, his acute on chronic renal failure resolved on IV fluids and the patient did progressively better. However, he still continued to have orthostatic hypotension intermittently requiring midodrine initiation. With this, the patient's condition overall improved. He was able to ambulate in  the hospital and had no significant discomfort or presyncopal episodes. His heart rate also normalized without beta blockers and was around 50s to 60s on room air at rest. It was felt that the patient would benefit to holding his beta blocker and follow up with his primary care physician care physician for further recommendations Unfortunately the patient's TSH  was not checked while he was in the hospital and this is recommended to be checked in the outpatient setting. 2. In regards to orthostatic hypotension. As mentioned above, there were a few reasons why the patient was orthostatic hypertensive, first of all  due to diuretics as well as possibly due clonidine and bradycardic as well as volume depleted. The patient's diuretics were placed  on hold. The patient was rehydrated and his orthostatic hypotension improved. He, however, required midodrine. It is recommended to follow patient's orthostatic vital signs were as outpatient and make decisions about advancing his midodrine  if needed.  3. In regards to acute renal failure, the patient's creatinine improved with IV fluid administration, as mentioned above, the patient's creatinine level was noted to be 1.80 on the date of admission 05/22/2012 however and that improved. The patient's creatinine came back to its baseline 1.38 on 05/23/2012. It is recommended to follow the patient's creatinine levels as outpatient and make decisions about medication therapy especially since the patient is still on hydrochlorothiazide as well as ACE inhibitor. 4. In regards to anemia. The patient's anemia was followed while he was in the hospital. Initially when he came into the hospital patient's hemoglobin was found to be in 8.9 The patient's hemoglobin level 9.3 on 05/21/2012. Iron studies revealed anemia of chronic disease. With rehydration the patient hemoglobin level did not worsen. On 05/25/2012 the patient's hemoglobin level is 9.7. The patient had occult blood and feces study done which was negative. The patient was evaluated by gastroenterologist Dr. Mechele Collin who follow him along. Dr. Mechele Collin felt that the patient had heme positive stools test in the past as well as anemia. He felt the patient should continue follow up and  he  felt that patient does not have. recent endoscopic evaluation at this time The patient, however needs  to be evaluated as outpatient and make decision about colonoscopy in the future especially if he did not have colonoscopy in the recent past despite his heme negative stool.  5. For diabetes mellitus, the patient was noted to be hypoglycemic in the hospital. He had to have insulin doses decreased significantly, short-acting insulin which was given for him in the past at 36 units  before meals was cut down to only 15 units before meals. With that, the patient's glucose level improved and he did not have any more hypoglycemic episodes. It was unclear why the patient would have sudden onset of hypoglycemia with his usual doses of insulin however, it was felt that the patient's hypoglycemia could have been related to his recent history of weight loss. Unfortunately, no hemoglobin A1c was taken while he was in the hospital. It is recommended to check the patient's hemoglobin A1c as an outpatient. The patient is to continue  current therapy and [facility] as an [facility]. 6. For generalized weakness, it was felt the patient denies weakness related to hypoglycemia as well as possibly orthostatic hypotension. The patient was ambulated in the hospital and had no significant problems or discomfort. 7. For  constipation the patient was given Colace as well as Dulcolax with  senna and he is to continue to follow up with his primary care physician as well as make decisions about colonoscopy in the future.  8. For weight loss. It was unclear if the patient's blood loss was related to intentional loss or unintentional. The patient is to  make decisions about evaluation as an outpatient as well. As mentioned above, the patient was evaluated by Dr. Mechele CollinElliott gastroenterologist who did not feel that he needs to have immediate procedure in the hospital. 9. For hypertension, the patient's blood pressure was fluctuating while in the hospital. The patient had his beta blocker metoprolol stopped. The patient was continued on his current medications. It is recommended to follow the patient's blood pressure readings and make decisions about advancement of his blood pressure medications if needed.  10. On the day of discharge, the patient's vital temperature was 98.6, pulse 50s, respiration rate 18, blood pressure 135/58, saturation 100% on room air at rest     The patient is being  discharged in stable condition with the above-mentioned medications and follow-up   Time spent:  40 minutes.   ____________________________ [name] Caperima Jacki Couse, MD [id number] D: 05/25/2012 18:17:28 ET T: 05/26/2012 11:10:04 ET JOB#: [id number]  cc: [name] Caperima Jhoselin Crume, MD, <Dictator> [facility] Moishe Schellenberg MD ELECTRONICALLY SIGNED 06/06/2012 12:50

## 2014-11-09 NOTE — Consult Note (Signed)
CC: anemia and heme positive stool.  Pt complains of constipation, no bowel movement this is 3rd day.  will give 2  dulcolax tabs mild bradycardia p 53, BP is 156/47.  Chest clear.  Retic ct 4.7 iron studies.  No endoscopic plans.    Electronic Signatures: Scot JunElliott, Robert T (MD)  (Signed on 02-Nov-13 10:51)  Authored  Last Updated: 02-Nov-13 10:51 by Scot JunElliott, Robert T (MD)
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
